# Patient Record
Sex: Female | Born: 1940 | Race: White | Hispanic: No | Marital: Married | Smoking: Never smoker
Health system: Southern US, Community
[De-identification: ages and names within clinical notes are randomized; demographics above are authoritative.]

## PROBLEM LIST (undated history)

## (undated) DIAGNOSIS — R51 Headache: Secondary | ICD-10-CM

## (undated) DIAGNOSIS — C801 Malignant (primary) neoplasm, unspecified: Secondary | ICD-10-CM

## (undated) DIAGNOSIS — I10 Essential (primary) hypertension: Secondary | ICD-10-CM

## (undated) DIAGNOSIS — C439 Malignant melanoma of skin, unspecified: Secondary | ICD-10-CM

## (undated) HISTORY — PX: ABDOMINAL HYSTERECTOMY: SHX81

## (undated) HISTORY — DX: Headache: R51

## (undated) HISTORY — PX: OTHER SURGICAL HISTORY: SHX169

## (undated) HISTORY — PX: TONSILLECTOMY: SUR1361

## (undated) HISTORY — DX: Malignant melanoma of skin, unspecified: C43.9

## (undated) HISTORY — PX: TUBAL LIGATION: SHX77

---

## 1998-03-19 ENCOUNTER — Ambulatory Visit (HOSPITAL_COMMUNITY): Admission: RE | Admit: 1998-03-19 | Discharge: 1998-03-19 | Payer: Self-pay | Admitting: Gastroenterology

## 2004-06-21 ENCOUNTER — Other Ambulatory Visit: Admission: RE | Admit: 2004-06-21 | Discharge: 2004-06-21 | Payer: Self-pay | Admitting: Obstetrics and Gynecology

## 2005-02-05 ENCOUNTER — Encounter: Admission: RE | Admit: 2005-02-05 | Discharge: 2005-02-05 | Payer: Self-pay | Admitting: Internal Medicine

## 2005-05-10 ENCOUNTER — Encounter: Admission: RE | Admit: 2005-05-10 | Discharge: 2005-05-10 | Payer: Self-pay | Admitting: *Deleted

## 2005-07-12 ENCOUNTER — Other Ambulatory Visit: Admission: RE | Admit: 2005-07-12 | Discharge: 2005-07-12 | Payer: Self-pay | Admitting: Obstetrics and Gynecology

## 2005-11-23 ENCOUNTER — Encounter: Admission: RE | Admit: 2005-11-23 | Discharge: 2005-11-23 | Payer: Self-pay | Admitting: Internal Medicine

## 2006-01-30 ENCOUNTER — Encounter: Admission: RE | Admit: 2006-01-30 | Discharge: 2006-01-30 | Payer: Self-pay

## 2006-04-25 ENCOUNTER — Encounter: Admission: RE | Admit: 2006-04-25 | Discharge: 2006-04-25 | Payer: Self-pay | Admitting: General Surgery

## 2006-05-08 ENCOUNTER — Ambulatory Visit (HOSPITAL_COMMUNITY): Admission: RE | Admit: 2006-05-08 | Discharge: 2006-05-08 | Payer: Self-pay | Admitting: Gastroenterology

## 2006-08-07 ENCOUNTER — Other Ambulatory Visit: Admission: RE | Admit: 2006-08-07 | Discharge: 2006-08-07 | Payer: Self-pay | Admitting: Obstetrics and Gynecology

## 2006-08-14 ENCOUNTER — Inpatient Hospital Stay (HOSPITAL_COMMUNITY): Admission: RE | Admit: 2006-08-14 | Discharge: 2006-08-17 | Payer: Self-pay | Admitting: General Surgery

## 2006-10-10 ENCOUNTER — Encounter: Admission: RE | Admit: 2006-10-10 | Discharge: 2006-10-10 | Payer: Self-pay | Admitting: General Surgery

## 2007-03-10 IMAGING — CR DG CHEST 2V
2 series · 2 of 2 positions shown · non-contrast
Comparison: 08/04/06.

CLINICAL DATA: Paraesophageal hernia.  Shortness of breath with exertion.  High blood pressure.
CHEST ? 2 VIEW:

[w chest pa]
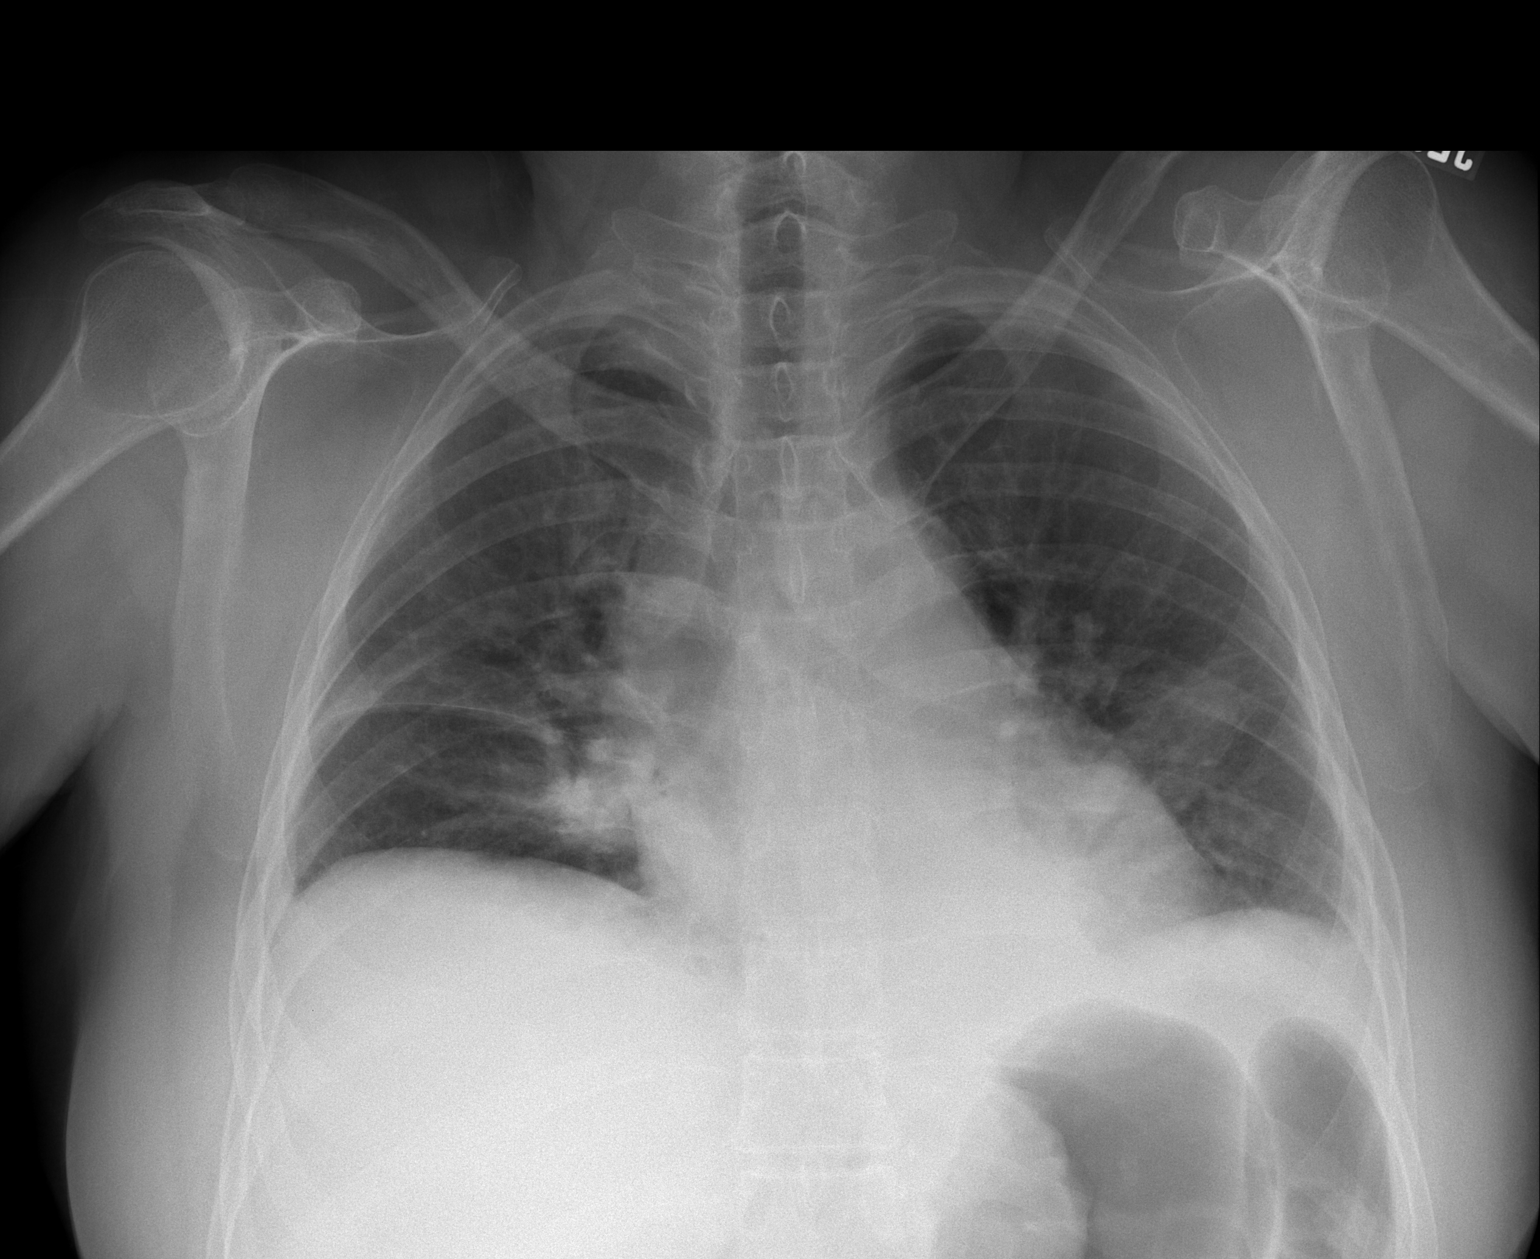

[w chest lat]
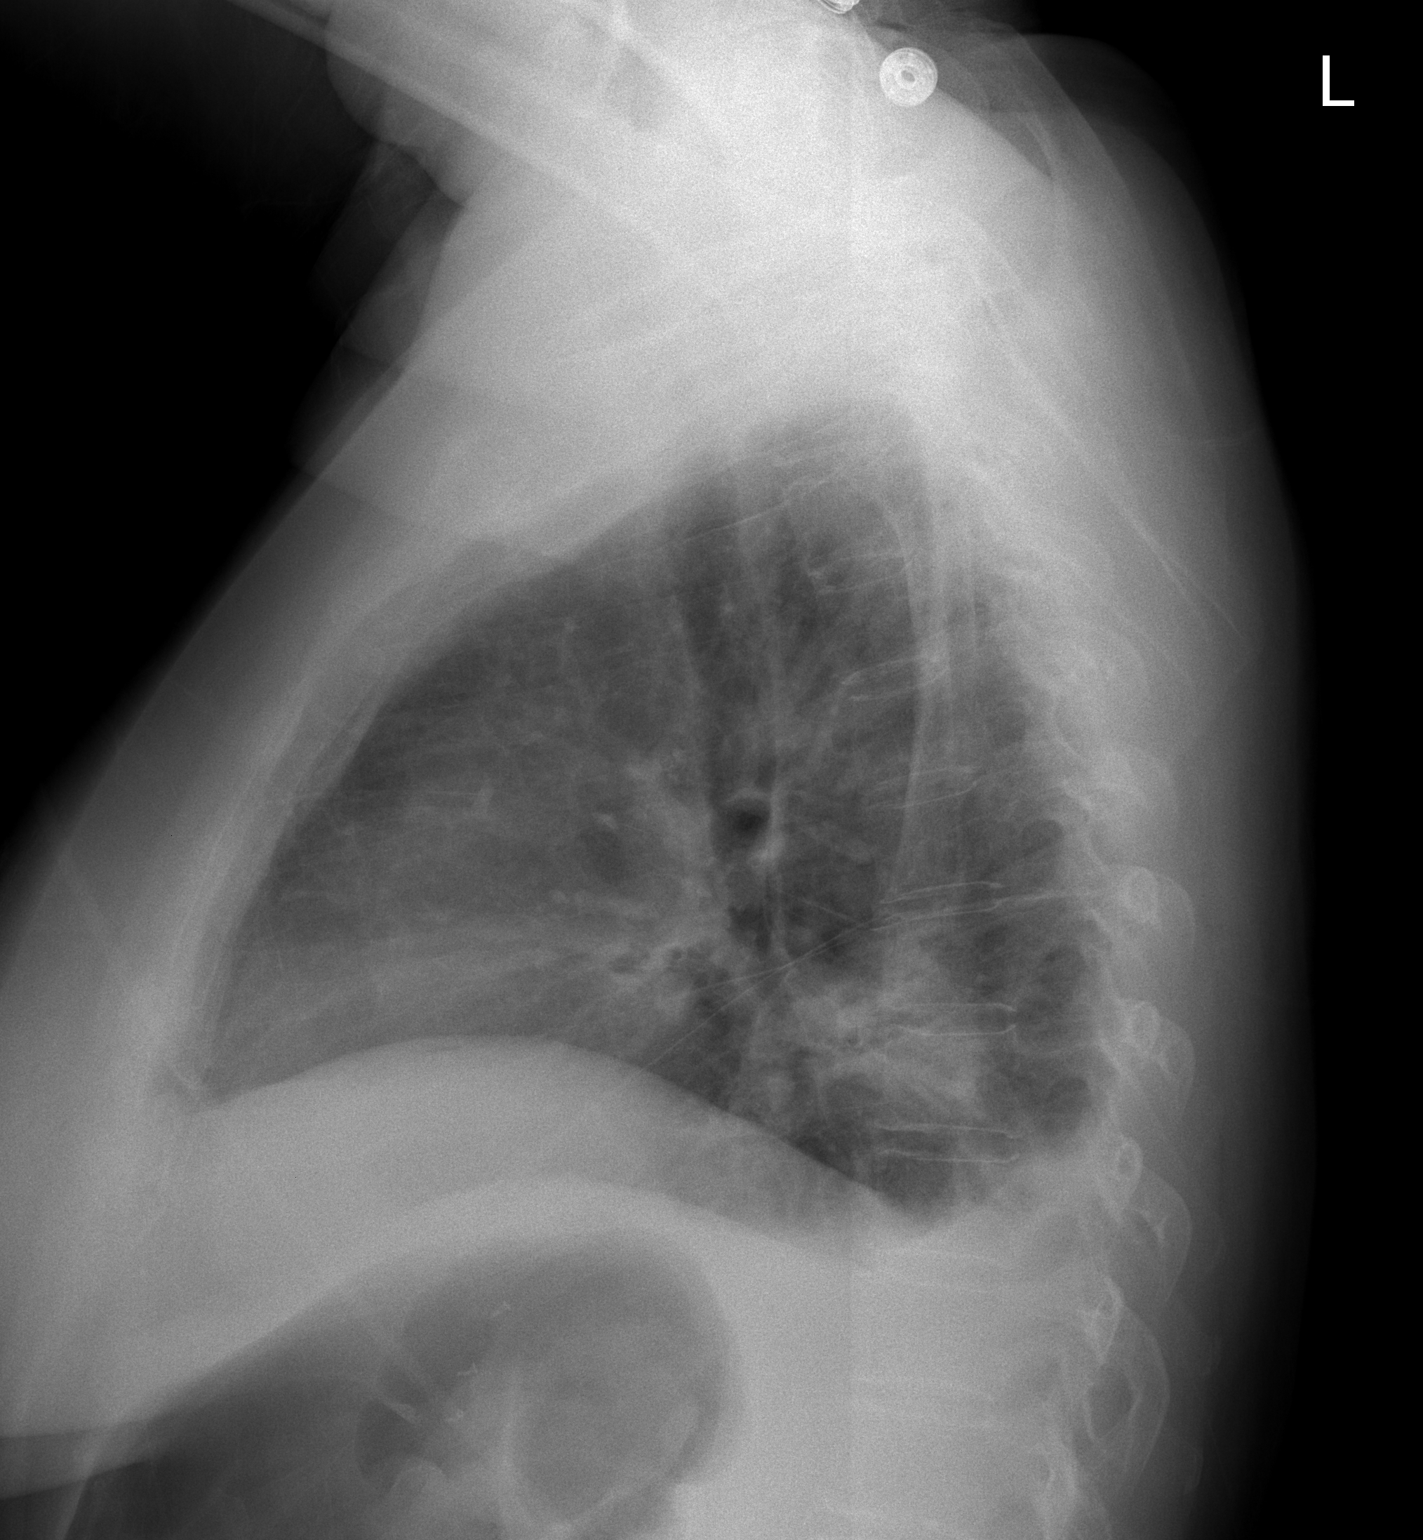

[2 of 2 positions shown; findings below may reference images not displayed]

FINDINGS: Band-like densities in the lower lung zones compatible with subsegmental atelectasis.  Pneumonia in the left lower lobe is a consideration, as well.  Small pleural effusions.
IMPRESSION: Subsegmental atelectasis in the lower lung zones, mainly the left lower lobe.  Cannot exclude left lower lobar pneumonia.  Small pleural effusions.

## 2007-08-02 ENCOUNTER — Ambulatory Visit: Payer: Self-pay | Admitting: Nurse Practitioner

## 2007-08-24 ENCOUNTER — Other Ambulatory Visit: Admission: RE | Admit: 2007-08-24 | Discharge: 2007-08-24 | Payer: Self-pay | Admitting: Obstetrics and Gynecology

## 2007-08-30 ENCOUNTER — Ambulatory Visit: Payer: Self-pay | Admitting: Nurse Practitioner

## 2007-10-18 ENCOUNTER — Ambulatory Visit: Payer: Self-pay | Admitting: Nurse Practitioner

## 2008-08-26 ENCOUNTER — Other Ambulatory Visit: Admission: RE | Admit: 2008-08-26 | Discharge: 2008-08-26 | Payer: Self-pay | Admitting: Obstetrics and Gynecology

## 2009-07-07 ENCOUNTER — Ambulatory Visit: Payer: Self-pay | Admitting: Nurse Practitioner

## 2009-08-27 ENCOUNTER — Other Ambulatory Visit: Admission: RE | Admit: 2009-08-27 | Discharge: 2009-08-27 | Payer: Self-pay | Admitting: Obstetrics and Gynecology

## 2010-08-03 ENCOUNTER — Ambulatory Visit: Payer: Self-pay | Admitting: Nurse Practitioner

## 2010-09-28 ENCOUNTER — Other Ambulatory Visit
Admission: RE | Admit: 2010-09-28 | Discharge: 2010-09-28 | Payer: Self-pay | Source: Home / Self Care | Admitting: Obstetrics and Gynecology

## 2010-10-10 ENCOUNTER — Encounter: Payer: Self-pay | Admitting: Internal Medicine

## 2011-02-01 NOTE — Assessment & Plan Note (Signed)
NAME:  Katelyn Moore, Katelyn Moore           ACCOUNT NO.:  [id number]   MEDICAL RECORD NO.:  [id number]          PATIENT TYPE:  POB   LOCATION:  [facility] at [facility]         FACILITY:  [facility]   PHYSICIAN:  Tinnie Gens, MD        DATE OF BIRTH:  22-Jan-1941   DATE OF SERVICE:                                  CLINIC NOTE   HISTORY OF PRESENT ILLNESS:  The patient comes to the office today for  followup on her migraine headaches.  The patient is usually seen  approximately once a year.  She is doing very well, in fact she is doing  well enough that she would like to decrease the dose of her imipramine.  She takes imipramine for headache prevention as well as insomnia.  She  has been taking 100 mg and would like to go down to 50.  Overall, she is  still doing very well.  She has a new grandson, Lorenz Coaster, and looking  forward to the holidays with him.   PHYSICAL EXAMINATION:  VITAL SIGNS:  Blood pressure is 129/72, pulse 65,  weight 136, height is 5 feet 1 inch.  GENERAL:  Well-developed, well-nourished 70 year old Caucasian female in  no acute distress.  HEENT:  Head is normocephalic and atraumatic.  Pupils are equal and  react.  NEUROLOGIC:  The patient is alert and oriented.  She has good muscle  tone and good muscle sensation.   ASSESSMENT:  Migraine.   PLAN:  We will decrease her imipramine to 50 mg nightly.  She gets #90  with 4 refills.  She will also have her Maxalt refilled.  Maxalt 10 mg  one p.o. p.r.n. migraine #12 with p.r.n. refills.  She will follow up  sooner if need be.      Remonia Richter, NP    ______________________________  Tinnie Gens, MD    LR/MEDQ  D:  08/03/2010  T:  08/04/2010  Job:  [id number]

## 2011-02-01 NOTE — Assessment & Plan Note (Signed)
NAME:  Katelyn Moore, Katelyn Moore           ACCOUNT NO.:  [id number]   MEDICAL RECORD NO.:  [id number]          PATIENT TYPE:  POB   LOCATION:  [facility] at [facility]         FACILITY:  [facility]   PHYSICIAN:  Allie Bossier, MD        DATE OF BIRTH:  05-28-1941   DATE OF SERVICE:                                  CLINIC NOTE   The patient comes to the office today for followup on her migraine  headaches.  The patient was noticing that she was having more headaches  likely related to having some increasing difficulty falling asleep.  She  states that there are times that it takes her up to 2 hours to fall  asleep.  She did increase her dose of imipramine from 50 mg to 100 and  that was helpful.  She is requesting a refill on her imipramine to 100  mg tablets.  She has been taking Maxalt with no difficulties.  She has a  new grandson Psychologist, forensic.  There was some difficulties around the birth.  She  has handled that very well.   PHYSICAL EXAMINATION:  VITAL SIGNS:  Blood pressure is 114/72, pulse is  71, weight 136, height 5 feet 1 inches.  HEENT:  Head is normocephalic and atraumatic.  Pupils equal and  reactive.  NEUROLOGIC:  The patient is alert, oriented with an appropriate affect.   ASSESSMENT:  Migraine.   PLAN:  The patient will increase her imipramine to 100 mg 1 p.o. at  bedtime #90 with 4 refills.  She will take a refill on her Maxalt 10 mg  one p.o. p.r.n. migraine #12 with p.r.n. refills.  She will follow up in  1 year or sooner as needed.      Remonia Richter, NP    ______________________________  Allie Bossier, MD    LR/MEDQ  D:  07/07/2009  T:  07/08/2009  Job:  [id number]

## 2011-02-04 NOTE — Op Note (Signed)
NAMEMYSTIC, LABO           ACCOUNT NO.:  [id number]   MEDICAL RECORD NO.:  [id number]          PATIENT TYPE:  AMB   LOCATION:  ENDO                         FACILITY:  [facility]   PHYSICIAN:  Tasia Catchings, M.D.   DATE OF BIRTH:  [birth year]   DATE OF PROCEDURE:  05/08/2006  DATE OF DISCHARGE:  05/08/2006                                 OPERATIVE REPORT   PROCEDURE:  Esophageal monometry.   BRIEF HISTORY:  Katelyn Moore is a 70 year old patient who has reflux  esophagitis and required esophageal dilatation in 1999.  However, in 2007,  she was discovered to have a paraesophageal hernia which requires repair.  The purpose of this procedure was to look for esophageal monometric evidence  of diffuse esophageal spasm precluding a Nissen fundoplication as well as  the repair.   FINDINGS:  1. Upper esophageal sphincter.  The resting pressure in the upper      esophageal sphincter appeared entirely normal with good relaxation time      with swallowing.  2. Esophageal body.  Nine wet swallows were obtained. Although the      computer suggested that 1 of the wet swallows led to a retrograde      contraction, I disagree with that and all 9 swallows appeared to have      peristaltic contractions.  In the distal esophagus, there were 2      episodes of repetitive contractions but no simultaneous contractions      were noted.  The remainder of the swallows produced absolutely normal      peristaltic contractions.  3. Lower esophageal sphincter.  Resting lower esophageal sphincter      pressure was on the low side of normal at 13.1, although this is not      necessarily reliable test for reflux.  The ileus did relax completely      with swallowing and on time suggesting there was no evidence of      achalasia.   FINAL IMPRESSION:  Normal monometry with no evidence of diffuse esophageal  spasm or achalasia.      Tasia Catchings, M.D.  Electronically Signed     JW/MEDQ  D:   05/11/2006  T:  05/11/2006  Job:  [id number]   cc:   Angelia Mould. Derrell Lolling, M.D.

## 2011-02-04 NOTE — Discharge Summary (Signed)
NAMESHAKYRA, MATTERA           ACCOUNT NO.:  [id number]   MEDICAL RECORD NO.:  [id number]          PATIENT TYPE:  INP   LOCATION:  1605                         FACILITY:  [facility]   PHYSICIAN:  Angelia Mould. Derrell Lolling, M.D.DATE OF BIRTH:  Nov 23, 1940   DATE OF ADMISSION:  08/14/2006  DATE OF DISCHARGE:  08/17/2006                               DISCHARGE SUMMARY   FINAL DIAGNOSES:  1. Paraesophageal hernia.  2. Gastroesophageal reflux disease.  3. Postoperative atelectasis, resolved.  4. Hypertension.  5. Migraine headaches.   OPERATION PERFORMED:  Laparoscopic reduction and repair of  paraesophageal hernia, laparoscopic Nissen fundoplication.   HISTORY:  This is a healthy 70 year old white female who has had reflux  symptoms for many years.  She has also had some dysphagia requiring  dilatation in the past.  She is dependent on proton pump inhibitors but  gets good relief of symptoms from proton pump inhibitors.  She also has  water brash and regurgitation in the recumbent position regardless of  medication.  A recent upper GI showed a large paraesophageal hernia but  no obstruction or stricture.  Recent upper endoscopy showed the  paraesophageal hernia to be small or to medium in size no other  abnormalities.  She also a recent colonoscopy which was normal.  I saw  her in evaluation as an outpatient.  She then went and had an ultrasound  which showed only a polyp in the gallbladder.  She had a manometry which  showed normal esophageal peristalsis.  Repair of her diaphragmatic  hernia and antireflux surgery was offered as an option for her.  Because  of her continued symptoms,  she wanted to have that done.  She was  admitted to the hospital electively.   PHYSICAL EXAMINATION:  Healthy, middle-aged woman in no distress.  LUNGS:  Were clear to auscultation.  HEART:  Regular rate and rhythm, no murmur.  ABDOMEN:  Soft, nontender.  Liver and spleen not enlarged.  Well-healed  infraumbilical laparoscopic scar.  Well-healed Pfannenstiel incision.  No mass, no hernias.   HOSPITAL COURSE:  On the date of admission, the patient was taken to the  operating room and underwent laparoscopic reduction and repair of her  paraesophageal hernia and a laparoscopic Nissen fundoplication.  Surgery  was uneventful.   Postoperatively, the patient did well.  A Gastrografin esophagogram was  normal on postoperative day #1, and she was started on clear liquid diet  at that time.  On postoperative day #2, she was feeling okay but had a  little bit of  left shoulder pain with deep breath.  Oxygen saturations  were 92-94% on room air.  A chest x-ray showed some atelectasis at the  bases.  She did not appear to be feeling well enough to go home, was  having some headache as well.   On August 17, 2006, she was feeling much better and was ready to go  home, had been tolerating a full liquid diet and was swallowing well  without dysphagia.  Hemoglobin was 11.5, white blood cell count was  10,400.   She was advised on activities and a blenderized  diet.  She was given a  prescription for Vicodin for pain and told to continue her usual  medications including her proton-pump inhibitors.  She was to follow-up  in the office in 2 weeks.      Angelia Mould. Derrell Lolling, M.D.  Electronically Signed     HMI/MEDQ  D:  08/28/2006  T:  08/28/2006  Job:  [id number]   cc:   Tasia Catchings, M.D.  Fax: [phone]   Darius Bump, M.D.  Fax: [phone]

## 2011-02-04 NOTE — Op Note (Signed)
Katelyn Moore, Katelyn Moore           ACCOUNT NO.:  [id number]   MEDICAL RECORD NO.:  [id number]          PATIENT TYPE:  OIB   LOCATION:  0098                         FACILITY:  [facility]   PHYSICIAN:  Angelia Mould. Derrell Lolling, M.D.DATE OF BIRTH:  10-Oct-1940   DATE OF PROCEDURE:  08/14/2006  DATE OF DISCHARGE:                                 OPERATIVE REPORT   PREOPERATIVE DIAGNOSES:  1. Paraesophageal hernia.  2. Gastroesophageal reflux disease.   POSTOPERATIVE DIAGNOSES:  1. Paraesophageal hernia.  2. Gastroesophageal reflux disease.   OPERATION PERFORMED:  1. Laparoscopic reduction and repair of paraesophageal hernia.  2. Laparoscopic Nissen fundoplication.   SURGEON:  Angelia Mould. Derrell Lolling, M.D.   ASSISTANT:  Sandria Bales. Ezzard Standing, M.D.   OPERATIVE INDICATIONS:  This is a 70 year old white female who has had  reflux symptoms for years with some dysphagia requiring dilatation in the  past.  There is no history of ulcer and no history of Barrett's esophagitis.  She is very dependent on proton pump inhibitors and has an excellent result  with proton pump inhibitors but gets very symptomatic if she stops that  medication.  Upper GI shows a moderate sized paraesophageal hernia.  Upper  endoscopy showed basically normal findings except for the paraesophageal  hernia.  A colonoscopy has been done and that is normal.  A gallbladder  ultrasound shows only a small 3 mm polyp.  Manometry shows good esophageal  peristalsis with good amplitude.  She is brought to operating room  electively.   OPERATIVE TECHNIQUE:  Following the induction of general endotracheal  anesthesia, the patient was identified as to correct patient and correct  procedure.  A Foley catheter was inserted.  Intravenous Ancef was given.  The patient's abdomen was prepped and draped in a sterile fashion.  0.5%  Marcaine with epinephrine was used as local infiltration anesthetic.  An 11  mm OptiVu port was placed in the left rectus  sheath about 2 cm above the  umbilicus.  This insertion was atraumatic.  Pneumoperitoneum was created.  A  video camera was inserted.  There was no bleeding or signs of any injury.  There really were no adhesions.  Two 11 mm trocars were placed in the right  upper quadrant, an 11 mm trocar placed in the left subcostal region for  retraction, and a 5 mm trocar placed the subxiphoid region.  We ultimately  removed the 5 mm trocar in the subxiphoid region and placed a Nathanson  retractor and this was placed to carefully retract the left lobe of liver.  This was held in place with a self-retaining retractor.   We found that there was a fair amount of chronic adhesions around the  hiatus.  The liver, gallbladder, stomach, duodenum, small intestine, and  large intestine were otherwise grossly normal to inspection.  Using a  harmonic scalpel, we divided the gastrohepatic omentum exposing the right  crus of the diaphragm.  We divided the attachments of the anterior border of  the right crus and with blunt dissection dissected it away from the  esophagus and the retroesophageal fatty tissues.  We carried this dissection  anteriorly freeing up the apex of the crura and then down the anterior  border of the left crus somewhat.  In doing so, we reduced the hernia which  was not that large.  There was not much or hernia sac but there was a great  deal of fatty tissue anteriorly which we ultimately had to debride away from  the gastroesophageal junction with the harmonic scalpel.  This tissue was  then discarded.   We took down the short gastric vessels with the harmonic scalpel.  These  were somewhat stretched out by the hernia and it was fairly easy to see and  stay well away from the spleen but by doing so we completely mobilize the  fundus and left it quite floppy.   We then went back to the dissection on the right side and found that we  could easily make a window behind the esophagus.  There  was a lipoma  associated with the posterior wall of the esophagus which we debrided down  and pulled that down somewhat.  We then put the Penrose drain behind the  esophagus for mobilization.  At this point, we completed the dissection of  the left crus posteriorly and we could easily see both the right and left  crura coming together inferiorly and posteriorly.  Hemostasis was excellent  at this point.  We had injured the capsule of the left lobe of the liver  with one of the instruments that required some cautery and lost about 15 mL  of blood from that but that was completely hemostatic at this point.  The  spleen looked good as well.   We closed the crura with interrupted sutures of 0 Surgitek.  These were tied  over PTFE felt pledgets.  We made sure that we had completed the dissection  of posterior esophagus quite well and that we had a good length of esophagus  in the abdominal cavity.  We placed three or four interrupted sutures of the  0 Surgitek with pledgeted PTFE felt and closed the crura without any tension  at all.  We then passed a 50 lighted bougie down into the stomach and found  that it would go without too much difficulty but there was no space between  the crura and the esophagus left, so no further sutures were placed.   We pulled the bougie back somewhat past the fundus behind the esophagus from  left to right.  We then pulled the fat pad down and found a piece of  anterior fundus on the left side.  We reinserted the bougie and made sure  that we had a very floppy fundoplication to set up and we could do this  without any tension whatsoever.  The fundoplication was performed using  three interrupted sutures of 0 Surgitek.  Each of these sutures took a bite  of the fundus on the left, a right-anterior bite of the esophagus, and then  the fundus on the right side.  These were tied with the Ti-knot device. After all three of these were placed, the bougie was removed.   The repair  looked good.  There was no sign of any bleeding or hematoma.  We irrigated  out the field.  We found no bleeding or any sign of any ischemia or  perforation.  The trocars were then removed under direct vision.  There was  no bleeding from any of the trocar sites.  The pneumoperitoneum was  released.  The skin incisions  were closed with Steri-Strips and skin  staples.  Clean bandages were placed and the patient taken to the recovery  room in stable condition.  Estimated blood loss was about 30 mL.  Complications none.  Sponge and instrument counts were correct.      Angelia Mould. Derrell Lolling, M.D.  Electronically Signed     HMI/MEDQ  D:  08/14/2006  T:  08/14/2006  Job:  [id number]   cc:   Tasia Catchings, M.D.  Fax: [phone]   Darius Bump, M.D.  Fax: [phone]

## 2011-08-29 ENCOUNTER — Telehealth: Payer: Self-pay | Admitting: *Deleted

## 2011-08-29 MED ORDER — RIZATRIPTAN BENZOATE 10 MG PO TABS: 10.0000 mg | ORAL_TABLET | Freq: Once | ORAL | Status: DC | PRN

## 2011-08-29 NOTE — Telephone Encounter (Signed)
Patient needs refill of Maxalt to get her through until she can get in to see [facility].

## 2011-09-27 ENCOUNTER — Encounter: Payer: Self-pay | Admitting: Nurse Practitioner

## 2011-09-27 ENCOUNTER — Ambulatory Visit (INDEPENDENT_AMBULATORY_CARE_PROVIDER_SITE_OTHER): Payer: MEDICARE | Admitting: Nurse Practitioner

## 2011-09-27 DIAGNOSIS — G43009 Migraine without aura, not intractable, without status migrainosus: Secondary | ICD-10-CM | POA: Insufficient documentation

## 2011-09-27 DIAGNOSIS — G43909 Migraine, unspecified, not intractable, without status migrainosus: Secondary | ICD-10-CM

## 2011-09-27 MED ORDER — RIZATRIPTAN BENZOATE 10 MG PO TABS: 10.0000 mg | ORAL_TABLET | Freq: Once | ORAL | Status: DC | PRN

## 2011-09-27 MED ORDER — IMIPRAMINE PAMOATE 100 MG PO CAPS: 100.0000 mg | ORAL_CAPSULE | Freq: Every day | ORAL | Status: DC

## 2011-09-27 NOTE — Patient Instructions (Signed)
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
## 2012-09-30 ENCOUNTER — Other Ambulatory Visit: Payer: Self-pay | Admitting: Nurse Practitioner

## 2012-10-09 ENCOUNTER — Encounter: Payer: Self-pay | Admitting: Nurse Practitioner

## 2012-10-09 ENCOUNTER — Ambulatory Visit (INDEPENDENT_AMBULATORY_CARE_PROVIDER_SITE_OTHER): Payer: MEDICARE | Admitting: Nurse Practitioner

## 2012-10-09 VITALS — BP 118/76 | HR 68 | Ht 61.0 in | Wt 134.0 lb

## 2012-10-09 DIAGNOSIS — G43909 Migraine, unspecified, not intractable, without status migrainosus: Secondary | ICD-10-CM

## 2012-10-09 MED ORDER — RIZATRIPTAN BENZOATE 10 MG PO TABS: 10.0000 mg | ORAL_TABLET | ORAL | Status: DC | PRN

## 2012-10-09 MED ORDER — IMIPRAMINE PAMOATE 100 MG PO CAPS: 100.0000 mg | ORAL_CAPSULE | Freq: Every day | ORAL | Status: DC

## 2012-10-09 NOTE — Patient Instructions (Addendum)
Migraine Headache A migraine headache is an intense, throbbing pain on one or both sides of your head. A migraine can last for 30 minutes to several hours. CAUSES  The exact cause of a migraine headache is not always known. However, a migraine may be caused when nerves in the brain become irritated and release chemicals that cause inflammation. This causes pain. SYMPTOMS  Pain on one or both sides of your head.  Pulsating or throbbing pain.  Severe pain that prevents daily activities.  Pain that is aggravated by any physical activity.  Nausea, vomiting, or both.  Dizziness.  Pain with exposure to bright lights, loud noises, or activity.  General sensitivity to bright lights, loud noises, or smells. Before you get a migraine, you may get warning signs that a migraine is coming (aura). An aura may include:  Seeing flashing lights.  Seeing bright spots, halos, or zig-zag lines.  Having tunnel vision or blurred vision.  Having feelings of numbness or tingling.  Having trouble talking.  Having muscle weakness. MIGRAINE TRIGGERS  Alcohol.  Smoking.  Stress.  Menstruation.  Aged cheeses.  Foods or drinks that contain nitrates, glutamate, aspartame, or tyramine.  Lack of sleep.  Chocolate.  Caffeine.  Hunger.  Physical exertion.  Fatigue.  Medicines used to treat chest pain (nitroglycerine), birth control pills, estrogen, and some blood pressure medicines. DIAGNOSIS  A migraine headache is often diagnosed based on:  Symptoms.  Physical examination.  A CT scan or MRI of your head. TREATMENT Medicines may be given for pain and nausea. Medicines can also be given to help prevent recurrent migraines.  HOME CARE INSTRUCTIONS  Only take over-the-counter or prescription medicines for pain or discomfort as directed by your caregiver. The use of long-term narcotics is not recommended.  Lie down in a dark, quiet room when you have a migraine.  Keep a journal  to find out what may trigger your migraine headaches. For example, write down:  What you eat and drink.  How much sleep you get.  Any change to your diet or medicines.  Limit alcohol consumption.  Quit smoking if you smoke.  Get 7 to 9 hours of sleep, or as recommended by your caregiver.  Limit stress.  Keep lights dim if bright lights bother you and make your migraines worse. SEEK IMMEDIATE MEDICAL CARE IF:   Your migraine becomes severe.  You have a fever.  You have a stiff neck.  You have vision loss.  You have muscular weakness or loss of muscle control.  You start losing your balance or have trouble walking.  You feel faint or pass out.  You have severe symptoms that are different from your first symptoms. MAKE SURE YOU:   Understand these instructions.  Will watch your condition.  Will get help right away if you are not doing well or get worse. Document Released: 09/05/2005 Document Revised: 11/28/2011 Document Reviewed: 08/26/2011 Exit[employer] Patient Information 2013 [employer].  

## 2012-10-09 NOTE — Progress Notes (Signed)
S: Pt is in office today for yearly follow up on migraines. She was doing very well up to one week ago when she ran out of her Imipramine. She called the office once but did not follow up when medication was not called in. Since then she has not done well. Usually she does well with Maxalt and Excedrin and Caffeine. She is exercising almost daily including aerobics and walking.   O: Alert, oriented Cardiac: RRR Lungs Clear  A: Migraine  P: Refill Imipramine, Maxalt RTC one year or prn

## 2012-11-28 ENCOUNTER — Other Ambulatory Visit (HOSPITAL_COMMUNITY)
Admission: RE | Admit: 2012-11-28 | Discharge: 2012-11-28 | Disposition: A | Discharge status: Home / Self Care | Payer: MEDICARE | Source: Ambulatory Visit | Attending: Obstetrics and Gynecology | Admitting: Obstetrics and Gynecology

## 2012-11-28 ENCOUNTER — Other Ambulatory Visit: Payer: Self-pay | Admitting: Obstetrics and Gynecology

## 2012-11-28 DIAGNOSIS — Z01419 Encounter for gynecological examination (general) (routine) without abnormal findings: Secondary | ICD-10-CM | POA: Insufficient documentation

## 2012-11-28 DIAGNOSIS — Z1151 Encounter for screening for human papillomavirus (HPV): Secondary | ICD-10-CM | POA: Insufficient documentation

## 2013-10-07 ENCOUNTER — Telehealth: Payer: Self-pay | Admitting: *Deleted

## 2013-10-07 DIAGNOSIS — G43909 Migraine, unspecified, not intractable, without status migrainosus: Secondary | ICD-10-CM

## 2013-10-07 MED ORDER — IMIPRAMINE PAMOATE 100 MG PO CAPS: 100.0000 mg | ORAL_CAPSULE | Freq: Every day | ORAL | Status: DC

## 2013-10-07 NOTE — Telephone Encounter (Signed)
Patient needs refill on her imipramine.  She made an appointment for the 27th to see [facility].

## 2013-10-15 ENCOUNTER — Ambulatory Visit (INDEPENDENT_AMBULATORY_CARE_PROVIDER_SITE_OTHER): Payer: Medicare Other | Admitting: Nurse Practitioner

## 2013-10-15 ENCOUNTER — Encounter: Payer: Self-pay | Admitting: Nurse Practitioner

## 2013-10-15 VITALS — BP 119/83 | HR 75 | Ht 60.75 in | Wt 140.2 lb

## 2013-10-15 DIAGNOSIS — G43909 Migraine, unspecified, not intractable, without status migrainosus: Secondary | ICD-10-CM

## 2013-10-15 MED ORDER — IMIPRAMINE HCL 50 MG PO TABS: 50.0000 mg | ORAL_TABLET | Freq: Every day | ORAL | Status: DC

## 2013-10-15 MED ORDER — RIZATRIPTAN BENZOATE 10 MG PO TABS: 10.0000 mg | ORAL_TABLET | ORAL | Status: DC | PRN

## 2013-10-15 NOTE — Progress Notes (Addendum)
History:  Katelyn Moore is a 73 y.o. No obstetric history on file. who presents to [place] today for yearly visit for migraine headaches. She has been doing well this year with her migraines and would like to decrease her prevention to Imipramine 50 mg. She had been at 100 mg. She will stay on Maxalt as needed and taper dose down on Imipramine. She has no new health or social issues.  The following portions of the patient's history were reviewed and updated as appropriate: allergies, current medications, past family history, past medical history, past social history, past surgical history and problem list.  Review of Systems:  Pertinent items are noted in HPI.  Objective:  Physical Exam BP 119/83  Pulse 75  Ht 5' 0.75" (1.543 m)  Wt 140 lb 3.2 oz (63.594 kg)  BMI 26.71 kg/m2 GENERAL: Well-developed, well-nourished female in no acute distress.  HEENT: Normocephalic, atraumatic.  NECK: Supple. Normal thyroid.  LUNGS: Normal rate. Clear to auscultation bilaterally.  HEART: Regular rate and rhythm with no adventitious sounds.  EXTREMITIES: No cyanosis, clubbing, or edema, 2+ distal pulses.   Labs and Imaging No results found.  Assessment & Plan:  Assessment:  Migraine Headaches  Plans:  Decrease dose of Imipramine to 50 mg q hs Refill Maxalt Follow up if any changes occur, otherwise yearly exam   Olegario Messier, NP 10/15/2013 2:10 PM  Attestation of Attending Supervision of Advanced Practitioner (CNM/NP): Evaluation and management procedures were performed by the Advanced Practitioner under my supervision and collaboration.  I have reviewed the Advanced Practitioner's note and chart, and I agree with the management and plan.  CONSTANT,PEGGY 12/17/2013 1:17 PM

## 2013-10-15 NOTE — Patient Instructions (Signed)
Migraine Headache A migraine headache is an intense, throbbing pain on one or both sides of your head. A migraine can last for 30 minutes to several hours. CAUSES  The exact cause of a migraine headache is not always known. However, a migraine may be caused when nerves in the brain become irritated and release chemicals that cause inflammation. This causes pain. Certain things may also trigger migraines, such as:  Alcohol.  Smoking.  Stress.  Menstruation.  Aged cheeses.  Foods or drinks that contain nitrates, glutamate, aspartame, or tyramine.  Lack of sleep.  Chocolate.  Caffeine.  Hunger.  Physical exertion.  Fatigue.  Medicines used to treat chest pain (nitroglycerine), birth control pills, estrogen, and some blood pressure medicines. SIGNS AND SYMPTOMS  Pain on one or both sides of your head.  Pulsating or throbbing pain.  Severe pain that prevents daily activities.  Pain that is aggravated by any physical activity.  Nausea, vomiting, or both.  Dizziness.  Pain with exposure to bright lights, loud noises, or activity.  General sensitivity to bright lights, loud noises, or smells. Before you get a migraine, you may get warning signs that a migraine is coming (aura). An aura may include:  Seeing flashing lights.  Seeing bright spots, halos, or zig-zag lines.  Having tunnel vision or blurred vision.  Having feelings of numbness or tingling.  Having trouble talking.  Having muscle weakness. DIAGNOSIS  A migraine headache is often diagnosed based on:  Symptoms.  Physical exam.  A CT scan or MRI of your head. These imaging tests cannot diagnose migraines, but they can help rule out other causes of headaches. TREATMENT Medicines may be given for pain and nausea. Medicines can also be given to help prevent recurrent migraines.  HOME CARE INSTRUCTIONS  Only take over-the-counter or prescription medicines for pain or discomfort as directed by your  health care provider. The use of long-term narcotics is not recommended.  Lie down in a dark, quiet room when you have a migraine.  Keep a journal to find out what may trigger your migraine headaches. For example, write down:  What you eat and drink.  How much sleep you get.  Any change to your diet or medicines.  Limit alcohol consumption.  Quit smoking if you smoke.  Get 7 9 hours of sleep, or as recommended by your health care provider.  Limit stress.  Keep lights dim if bright lights bother you and make your migraines worse. SEEK IMMEDIATE MEDICAL CARE IF:   Your migraine becomes severe.  You have a fever.  You have a stiff neck.  You have vision loss.  You have muscular weakness or loss of muscle control.  You start losing your balance or have trouble walking.  You feel faint or pass out.  You have severe symptoms that are different from your first symptoms. MAKE SURE YOU:   Understand these instructions.  Will watch your condition.  Will get help right away if you are not doing well or get worse. Document Released: 09/05/2005 Document Revised: 06/26/2013 Document Reviewed: 05/13/2013 [employer] Patient Information 2014 [employer].  

## 2014-09-30 ENCOUNTER — Ambulatory Visit (INDEPENDENT_AMBULATORY_CARE_PROVIDER_SITE_OTHER): Payer: Medicare Other | Admitting: Nurse Practitioner

## 2014-09-30 ENCOUNTER — Encounter: Payer: Self-pay | Admitting: Nurse Practitioner

## 2014-09-30 VITALS — BP 123/77 | HR 74 | Wt 140.0 lb

## 2014-09-30 DIAGNOSIS — G43009 Migraine without aura, not intractable, without status migrainosus: Secondary | ICD-10-CM

## 2014-09-30 MED ORDER — RIZATRIPTAN BENZOATE 10 MG PO TABS: 10.0000 mg | ORAL_TABLET | ORAL | Status: DC | PRN

## 2014-09-30 MED ORDER — IMIPRAMINE HCL 50 MG PO TABS: 50.0000 mg | ORAL_TABLET | Freq: Every day | ORAL | Status: DC

## 2014-09-30 NOTE — Patient Instructions (Signed)

## 2014-09-30 NOTE — Progress Notes (Signed)
History:  Katelyn Moore is a 74 y.o. No obstetric history on file. who presents to [facility] clinic today for yearly follow up with migraines. She states her migraines are about the same and usually she does not use the entire 12 tablets for headaches. She usually catches them early and does not use Motrin at the same time. She has had 2 health issues in this past year, one is left nipple inverting. She sae Dr Landry Mellow, had a mammogram and ultrasound. The second issues is of an abnormal EKG whith an insurance physical. She has this done in August and will be seeing her PCP soon for further evaluation. She has no cardiac issues to her knowledge.   The following portions of the patient's history were reviewed and updated as appropriate: allergies, current medications, past family history, past medical history, past social history, past surgical history and problem list.  Review of Systems:    Objective:  Physical Exam BP 123/77 mmHg  Pulse 74  Wt 140 lb (63.504 kg) GENERAL: Well-developed, well-nourished female in no acute distress.  HEENT: Normocephalic, atraumatic.  NECK: Supple. Normal thyroid.  LUNGS: Normal rate. Clear to auscultation bilaterally.  HEART: Irregular rate and rhythm EXTREMITIES: No cyanosis, clubbing, or edema, 2+ distal pulses.   Labs and Imaging No results found.  Assessment & Plan:  Assessment:  Migraine  Plans:  Refill medications Advised to follow with PCP for repeat EKG  Follow up one year  Olegario Messier, NP 09/30/2014 4:56 PM

## 2014-11-05 ENCOUNTER — Other Ambulatory Visit: Payer: Self-pay | Admitting: Radiology

## 2014-11-18 ENCOUNTER — Other Ambulatory Visit (INDEPENDENT_AMBULATORY_CARE_PROVIDER_SITE_OTHER): Payer: Self-pay | Admitting: General Surgery

## 2014-11-18 DIAGNOSIS — D242 Benign neoplasm of left breast: Secondary | ICD-10-CM

## 2014-11-21 ENCOUNTER — Encounter (HOSPITAL_BASED_OUTPATIENT_CLINIC_OR_DEPARTMENT_OTHER): Payer: Self-pay | Admitting: *Deleted

## 2014-11-21 ENCOUNTER — Encounter (HOSPITAL_BASED_OUTPATIENT_CLINIC_OR_DEPARTMENT_OTHER)
Admission: RE | Admit: 2014-11-21 | Discharge: 2014-11-21 | Disposition: A | Discharge status: Home / Self Care | Payer: Medicare Other | Source: Ambulatory Visit | Attending: General Surgery | Admitting: General Surgery

## 2014-11-21 DIAGNOSIS — I10 Essential (primary) hypertension: Secondary | ICD-10-CM | POA: Diagnosis not present

## 2014-11-21 DIAGNOSIS — Z885 Allergy status to narcotic agent status: Secondary | ICD-10-CM | POA: Diagnosis not present

## 2014-11-21 DIAGNOSIS — G43909 Migraine, unspecified, not intractable, without status migrainosus: Secondary | ICD-10-CM | POA: Diagnosis not present

## 2014-11-21 DIAGNOSIS — K219 Gastro-esophageal reflux disease without esophagitis: Secondary | ICD-10-CM | POA: Diagnosis not present

## 2014-11-21 DIAGNOSIS — D242 Benign neoplasm of left breast: Secondary | ICD-10-CM | POA: Diagnosis present

## 2014-11-21 DIAGNOSIS — Z9889 Other specified postprocedural states: Secondary | ICD-10-CM | POA: Diagnosis not present

## 2014-11-21 DIAGNOSIS — Z90710 Acquired absence of both cervix and uterus: Secondary | ICD-10-CM | POA: Diagnosis not present

## 2014-11-21 DIAGNOSIS — Z803 Family history of malignant neoplasm of breast: Secondary | ICD-10-CM | POA: Diagnosis not present

## 2014-11-21 DIAGNOSIS — Z886 Allergy status to analgesic agent status: Secondary | ICD-10-CM | POA: Diagnosis not present

## 2014-11-21 DIAGNOSIS — F1099 Alcohol use, unspecified with unspecified alcohol-induced disorder: Secondary | ICD-10-CM | POA: Diagnosis not present

## 2014-11-21 DIAGNOSIS — E785 Hyperlipidemia, unspecified: Secondary | ICD-10-CM | POA: Diagnosis not present

## 2014-11-21 LAB — CBC WITH DIFFERENTIAL/PLATELET
BASOS PCT: 0 % (ref 0–1)
Basophils Absolute: 0 10*3/uL (ref 0.0–0.1)
EOS PCT: 2 % (ref 0–5)
Eosinophils Absolute: 0.1 10*3/uL (ref 0.0–0.7)
HEMATOCRIT: 39.5 % (ref 36.0–46.0)
Hemoglobin: 13.3 g/dL (ref 12.0–15.0)
Lymphocytes Relative: 28 % (ref 12–46)
Lymphs Abs: 1.7 10*3/uL (ref 0.7–4.0)
MCH: 31.4 pg (ref 26.0–34.0)
MCHC: 33.7 g/dL (ref 30.0–36.0)
MCV: 93.4 fL (ref 78.0–100.0)
MONO ABS: 0.6 10*3/uL (ref 0.1–1.0)
Monocytes Relative: 9 % (ref 3–12)
Neutro Abs: 3.8 10*3/uL (ref 1.7–7.7)
Neutrophils Relative %: 61 % (ref 43–77)
PLATELETS: 230 10*3/uL (ref 150–400)
RBC: 4.23 MIL/uL (ref 3.87–5.11)
RDW: 12.4 % (ref 11.5–15.5)
WBC: 6.2 10*3/uL (ref 4.0–10.5)

## 2014-11-21 LAB — COMPREHENSIVE METABOLIC PANEL
ALK PHOS: 81 U/L (ref 39–117)
ALT: 20 U/L (ref 0–35)
AST: 27 U/L (ref 0–37)
Albumin: 4.1 g/dL (ref 3.5–5.2)
Anion gap: 8 (ref 5–15)
BUN: 18 mg/dL (ref 6–23)
CO2: 30 mmol/L (ref 19–32)
Calcium: 9.4 mg/dL (ref 8.4–10.5)
Chloride: 99 mmol/L (ref 96–112)
Creatinine, Ser: 0.86 mg/dL (ref 0.50–1.10)
GFR calc non Af Amer: 65 mL/min — ABNORMAL LOW (ref >90)
GFR, EST AFRICAN AMERICAN: 76 mL/min — AB (ref >90)
GLUCOSE: 121 mg/dL — AB (ref 70–99)
POTASSIUM: 4 mmol/L (ref 3.5–5.1)
SODIUM: 137 mmol/L (ref 135–145)
Total Bilirubin: 0.8 mg/dL (ref 0.3–1.2)
Total Protein: 6.5 g/dL (ref 6.0–8.3)

## 2014-11-21 NOTE — Progress Notes (Addendum)
Requested EKG from Dr. Quin Hoop office - pt had done Jan. 2016. Pt coming today for CMET CBC, Diff.

## 2014-11-21 NOTE — H&P (Signed)
Katelyn Moore  Location: [facility] Patient #: [id number] DOB: 02-26-1941 Married / Language: English / Race: White Female      History of Present Illness   The patient is a 74 year old female who presents with a breast mass. She is referred by Dr. Christene Slates at [facility] mammography for evaluation of intraductal papilloma of the left breast, subareolar area. Dr. Kelton Pillar is her PCP and Dr. Christophe Louis is her gynecologist. She has not had any breast problems in the past. Gets annual mammograms. Noticed a little bit of flattening of the left nipple and noticed a little bit of colorless drainage on her nightgown. This was very recent. No blood. Mammograms and ultrasound were performed on October 30, 2014. Mammograms were unremarkable. The ultrasound shows a complex cyst in the retroareolar area at the 8 o'clock position, papilloma was suggested. Ultrasound-guided biopsy of the left breast upper inner quadrant performed on November 05, 2014 shows sclerosed intraductal papilloma. I have reviewed her mammograms, postbiopsy, and the marker clip appears to be fairly centralized in the retroareolar area and back about 2 cm. She was referred for consideration of excision. Comorbidities include hypertension, hyperlipidemia. Operative performed laparoscopic repair paraesophageal hernia many years ago she has enjoyed a good result from that. Family history reveals breast cancer in a paternal aunt. Ovarian cancer in another paternal aunt. She is married and has 2 sons denies tobacco. Retired. We decided to proceed with left breast lumpectomy with radioactive seed localization. She is very much in favor of clarifying her diagnosis. I discussed the indications, details, techniques, and numerous risk of the surgery with her. She is aware of the risk of bleeding, infection, cosmetic deformity, skin necrosis, reoperation if this is cancer, and other unforeseen problems.  She understands all of these issues and all of her questions are answered. She agrees with this plan.1   Other Problems Cancer Gastroesophageal Reflux Disease High blood pressure Migraine Headache  Past Surgical History  Breast Biopsy Left. Hysterectomy (not due to cancer) - Partial Nissen Fundoplication Tonsillectomy  Diagnostic Studies History  Colonoscopy 5-10 years ago Mammogram within last year Pap Smear 1-5 years ago  Allergies  Codeine Phosphate *ANALGESICS - OPIOID* Morphine Sulfate (Concentrate) *ANALGESICS - OPIOID*  Medication History  Atenolol (100MG  Tablet, Oral) Active. Hydrochlorothiazide (12.5MG  Tablet, Oral) Active. Imipramine HCl (50MG  Tablet, Oral) Active. Maxalt-MLT (10MG  Tablet Disperse, Oral) Active. Simvastatin (20MG  Tablet, Oral) Active. Medications Reconciled  Social History  Alcohol use Occasional alcohol use. Caffeine use Coffee, Tea. No drug use Tobacco use Never smoker.  Family History  Breast Cancer Family Members In General. Heart Disease Father. Hypertension Brother, Father, Sister. Melanoma Mother. Migraine Headache Mother. Ovarian Cancer Family Members In General.  Pregnancy / Birth History Age at menarche 58 years. Age of menopause 51-55 Contraceptive History Oral contraceptives. Gravida 3 Maternal age 6-30 Para 2 Regular periods  Review of Systems General Present- Night Sweats. Not Present- Appetite Loss, Chills, Fatigue, Fever, Weight Gain and Weight Loss. Skin Not Present- Change in Wart/Mole, Dryness, Hives, Jaundice, New Lesions, Non-Healing Wounds, Rash and Ulcer. HEENT Present- Wears glasses/contact lenses. Not Present- Earache, Hearing Loss, Hoarseness, Nose Bleed, Oral Ulcers, Ringing in the Ears, Seasonal Allergies, Sinus Pain, Sore Throat, Visual Disturbances and Yellow Eyes. Respiratory Not Present- Bloody sputum, Chronic Cough, Difficulty Breathing, Snoring and  Wheezing. Breast Present- Breast Mass and Nipple Discharge. Not Present- Breast Pain and Skin Changes. Cardiovascular Not Present- Chest Pain, Difficulty Breathing Lying Down, Leg Cramps, Palpitations, Rapid Heart Rate,  Shortness of Breath and Swelling of Extremities. Gastrointestinal Not Present- Abdominal Pain, Bloating, Bloody Stool, Change in Bowel Habits, Chronic diarrhea, Constipation, Difficulty Swallowing, Excessive gas, Gets full quickly at meals, Hemorrhoids, Indigestion, Nausea, Rectal Pain and Vomiting. Female Genitourinary Not Present- Frequency, Nocturia, Painful Urination, Pelvic Pain and Urgency. Musculoskeletal Not Present- Back Pain, Joint Pain, Joint Stiffness, Muscle Pain, Muscle Weakness and Swelling of Extremities. Neurological Present- Headaches. Not Present- Decreased Memory, Fainting, Numbness, Seizures, Tingling, Tremor, Trouble walking and Weakness. Psychiatric Not Present- Anxiety, Bipolar, Change in Sleep Pattern, Depression, Fearful and Frequent crying. Endocrine Present- Hot flashes. Not Present- Cold Intolerance, Excessive Hunger, Hair Changes, Heat Intolerance and New Diabetes. Hematology Not Present- Easy Bruising, Excessive bleeding, Gland problems, HIV and Persistent Infections.   Vitals  11/18/2014 4:29 PM Weight: 139 lb Height: 61in Body Surface Area: 1.65 m Body Mass Index: 26.26 kg/m Temp.: 97.52F(Temporal)  Pulse: 77 (Regular)  BP: 122/72 (Sitting, Left Arm, Standard)    Physical Exam  General Mental Status-Alert. General Appearance-Consistent with stated age. Hydration-Well hydrated. Voice-Normal.  Head and Neck Head-normocephalic, atraumatic with no lesions or palpable masses. Trachea-midline. Thyroid Gland Characteristics - normal size and consistency.  Eye Eyeball - Bilateral-Extraocular movements intact. Sclera/Conjunctiva - Bilateral-No scleral icterus.  Chest and Lung Exam Chest and lung exam reveals  -quiet, even and easy respiratory effort with no use of accessory muscles and on auscultation, normal breath sounds, no adventitious sounds and normal vocal resonance. Inspection Chest Wall - Normal. Back - normal.  Breast Breast - Left-Symmetric and Biopsy scar, Non Tender, No Dimpling, No Inflammation, No Lumpectomy scars, No Mastectomy scars, No Peau d' Orange. Breast - Right-Symmetric, Non Tender, No Biopsy scars, no Dimpling, No Inflammation, No Lumpectomy scars, No Mastectomy scars, No Peau d' Orange. Breast Lump-No Palpable Breast Mass. Note: Left breast reveals a little bit of bruising periareolar area inferior and lateral. No mass or hematoma. Nipple and areolar complex looks fine. No axillary adenopathy. Right breast and axilla are unremarkable.   Cardiovascular Cardiovascular examination reveals -normal heart sounds, regular rate and rhythm with no murmurs and normal pedal pulses bilaterally.  Abdomen Inspection Inspection of the abdomen reveals - No Hernias. Palpation/Percussion Palpation and Percussion of the abdomen reveal - Soft, Non Tender, No Rebound tenderness, No Rigidity (guarding) and No hepatosplenomegaly. Auscultation Auscultation of the abdomen reveals - Bowel sounds normal. Note: Abdomen soft and nontender. No mass or organomegaly. Well-healed trocar sites from her hiatal hernia repair.   Neurologic Neurologic evaluation reveals -alert and oriented x 3 with no impairment of recent or remote memory. Mental Status-Normal.  Musculoskeletal Normal Exam - Left-Upper Extremity Strength Normal and Lower Extremity Strength Normal. Normal Exam - Right-Upper Extremity Strength Normal and Lower Extremity Strength Normal.  Lymphatic Head & Neck  General Head & Neck Lymphatics: Bilateral - Description - Normal. Axillary  General Axillary Region: Bilateral - Description - Normal. Tenderness - Non Tender. Femoral & Inguinal  Generalized Femoral &  Inguinal Lymphatics: Bilateral - Description - Normal. Tenderness - Non Tender.    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 The biopsy shows a benign tumor called intraductal papilloma There is a 90% or greater chance that this is completely benign, but conservative excision is recommended to rule out in situ cancer We have discussed the indications, techniques, and numerous risks of this surgery in detail You will  be scheduled for left breast lumpectomy with radioactive seed localization in the near future. HYPERTENSION, BENIGN (401.1  I10) Impression: Takes hydrochlorothiazide and atenolol HISTORY OF REPAIR OF HIATAL HERNIA (V15.29  Z98.89) Impression: Good long-term result   Edsel Petrin. Dalbert Batman, M.D., [facility] General and Minimally invasive Surgery Breast and Colorectal Surgery Office:   [phone] Pager:   [phone]

## 2014-11-24 ENCOUNTER — Ambulatory Visit (HOSPITAL_BASED_OUTPATIENT_CLINIC_OR_DEPARTMENT_OTHER): Payer: Medicare Other | Admitting: Anesthesiology

## 2014-11-24 ENCOUNTER — Encounter (HOSPITAL_BASED_OUTPATIENT_CLINIC_OR_DEPARTMENT_OTHER)
Admission: RE | Disposition: A | Discharge status: Home / Self Care | Payer: Self-pay | Source: Ambulatory Visit | Attending: General Surgery

## 2014-11-24 ENCOUNTER — Encounter (HOSPITAL_BASED_OUTPATIENT_CLINIC_OR_DEPARTMENT_OTHER): Payer: Self-pay | Admitting: *Deleted

## 2014-11-24 ENCOUNTER — Ambulatory Visit (HOSPITAL_BASED_OUTPATIENT_CLINIC_OR_DEPARTMENT_OTHER)
Admission: RE | Admit: 2014-11-24 | Discharge: 2014-11-24 | Disposition: A | Discharge status: Home / Self Care | Payer: Medicare Other | Source: Ambulatory Visit | Attending: General Surgery | Admitting: General Surgery

## 2014-11-24 DIAGNOSIS — Z803 Family history of malignant neoplasm of breast: Secondary | ICD-10-CM | POA: Insufficient documentation

## 2014-11-24 DIAGNOSIS — D242 Benign neoplasm of left breast: Secondary | ICD-10-CM | POA: Diagnosis not present

## 2014-11-24 DIAGNOSIS — Z90710 Acquired absence of both cervix and uterus: Secondary | ICD-10-CM | POA: Insufficient documentation

## 2014-11-24 DIAGNOSIS — Z9889 Other specified postprocedural states: Secondary | ICD-10-CM | POA: Insufficient documentation

## 2014-11-24 DIAGNOSIS — Z886 Allergy status to analgesic agent status: Secondary | ICD-10-CM | POA: Insufficient documentation

## 2014-11-24 DIAGNOSIS — E785 Hyperlipidemia, unspecified: Secondary | ICD-10-CM | POA: Insufficient documentation

## 2014-11-24 DIAGNOSIS — G43909 Migraine, unspecified, not intractable, without status migrainosus: Secondary | ICD-10-CM | POA: Insufficient documentation

## 2014-11-24 DIAGNOSIS — I10 Essential (primary) hypertension: Secondary | ICD-10-CM | POA: Diagnosis not present

## 2014-11-24 DIAGNOSIS — F1099 Alcohol use, unspecified with unspecified alcohol-induced disorder: Secondary | ICD-10-CM | POA: Insufficient documentation

## 2014-11-24 DIAGNOSIS — K219 Gastro-esophageal reflux disease without esophagitis: Secondary | ICD-10-CM | POA: Insufficient documentation

## 2014-11-24 DIAGNOSIS — Z885 Allergy status to narcotic agent status: Secondary | ICD-10-CM | POA: Insufficient documentation

## 2014-11-24 HISTORY — DX: Essential (primary) hypertension: I10

## 2014-11-24 HISTORY — DX: Malignant (primary) neoplasm, unspecified: C80.1

## 2014-11-24 HISTORY — PX: BREAST LUMPECTOMY WITH RADIOACTIVE SEED LOCALIZATION: SHX6424

## 2014-11-24 SURGERY — BREAST LUMPECTOMY WITH RADIOACTIVE SEED LOCALIZATION
Anesthesia: General | Site: Breast | Laterality: Left

## 2014-11-24 MED ORDER — CEFAZOLIN SODIUM-DEXTROSE 2-3 GM-% IV SOLR: 2.0000 g | INTRAVENOUS | Status: DC

## 2014-11-24 MED ORDER — CEFAZOLIN SODIUM-DEXTROSE 2-3 GM-% IV SOLR
INTRAVENOUS | Status: DC | PRN
  Administered 2014-11-24: 2 g via INTRAVENOUS

## 2014-11-24 MED ORDER — OXYCODONE HCL 5 MG PO TABS: 5.0000 mg | ORAL_TABLET | Freq: Once | ORAL | Status: DC | PRN

## 2014-11-24 MED ORDER — SODIUM CHLORIDE 0.9 % IJ SOLN: 3.0000 mL | Freq: Two times a day (BID) | INTRAMUSCULAR | Status: DC

## 2014-11-24 MED ORDER — HYDROCODONE-ACETAMINOPHEN 5-325 MG PO TABS: 1.0000 | ORAL_TABLET | Freq: Four times a day (QID) | ORAL | Status: DC | PRN

## 2014-11-24 MED ORDER — PROPOFOL 10 MG/ML IV BOLUS
INTRAVENOUS | Status: DC | PRN
  Administered 2014-11-24: 150 mg via INTRAVENOUS

## 2014-11-24 MED ORDER — MIDAZOLAM HCL 5 MG/5ML IJ SOLN
INTRAMUSCULAR | Status: DC | PRN
  Administered 2014-11-24: 2 mg via INTRAVENOUS

## 2014-11-24 MED ORDER — BUPIVACAINE-EPINEPHRINE (PF) 0.5% -1:200000 IJ SOLN
INTRAMUSCULAR | Status: AC
  Filled 2014-11-24: qty 30

## 2014-11-24 MED ORDER — ONDANSETRON HCL 4 MG/2ML IJ SOLN: 4.0000 mg | Freq: Once | INTRAMUSCULAR | Status: DC | PRN

## 2014-11-24 MED ORDER — LACTATED RINGERS IV SOLN
INTRAVENOUS | Status: DC
  Administered 2014-11-24 (×2): via INTRAVENOUS

## 2014-11-24 MED ORDER — OXYCODONE HCL 5 MG/5ML PO SOLN: 5.0000 mg | Freq: Once | ORAL | Status: DC | PRN

## 2014-11-24 MED ORDER — GLYCOPYRROLATE 0.2 MG/ML IJ SOLN
INTRAMUSCULAR | Status: DC | PRN
  Administered 2014-11-24: 0.2 mg via INTRAVENOUS

## 2014-11-24 MED ORDER — BUPIVACAINE-EPINEPHRINE (PF) 0.5% -1:200000 IJ SOLN
INTRAMUSCULAR | Status: DC | PRN
  Administered 2014-11-24: 10 mL via PERINEURAL

## 2014-11-24 MED ORDER — FENTANYL CITRATE 0.05 MG/ML IJ SOLN
INTRAMUSCULAR | Status: DC | PRN
  Administered 2014-11-24 (×2): 50 ug via INTRAVENOUS

## 2014-11-24 MED ORDER — SODIUM CHLORIDE 0.9 % IV SOLN: INTRAVENOUS | Status: DC

## 2014-11-24 MED ORDER — CEFAZOLIN SODIUM-DEXTROSE 2-3 GM-% IV SOLR
INTRAVENOUS | Status: AC
  Filled 2014-11-24: qty 50

## 2014-11-24 MED ORDER — EPHEDRINE SULFATE 50 MG/ML IJ SOLN
INTRAMUSCULAR | Status: DC | PRN
  Administered 2014-11-24: 10 mg via INTRAVENOUS

## 2014-11-24 MED ORDER — CHLORHEXIDINE GLUCONATE 4 % EX LIQD: 1.0000 "application " | Freq: Once | CUTANEOUS | Status: DC

## 2014-11-24 MED ORDER — ONDANSETRON HCL 4 MG/2ML IJ SOLN
INTRAMUSCULAR | Status: DC | PRN
  Administered 2014-11-24: 4 mg via INTRAVENOUS

## 2014-11-24 MED ORDER — SODIUM CHLORIDE 0.9 % IV SOLN: 250.0000 mL | INTRAVENOUS | Status: DC | PRN

## 2014-11-24 MED ORDER — FENTANYL CITRATE 0.05 MG/ML IJ SOLN
25.0000 ug | INTRAMUSCULAR | Status: DC | PRN
Start: 2014-11-24 — End: 2014-11-24

## 2014-11-24 MED ORDER — HYDROMORPHONE HCL 1 MG/ML IJ SOLN: 0.2500 mg | INTRAMUSCULAR | Status: DC | PRN

## 2014-11-24 MED ORDER — DEXAMETHASONE SODIUM PHOSPHATE 4 MG/ML IJ SOLN
INTRAMUSCULAR | Status: DC | PRN
  Administered 2014-11-24: 10 mg via INTRAVENOUS

## 2014-11-24 MED ORDER — MIDAZOLAM HCL 2 MG/2ML IJ SOLN: 1.0000 mg | INTRAMUSCULAR | Status: DC | PRN

## 2014-11-24 MED ORDER — FENTANYL CITRATE 0.05 MG/ML IJ SOLN: 50.0000 ug | INTRAMUSCULAR | Status: DC | PRN

## 2014-11-24 MED ORDER — ACETAMINOPHEN 325 MG PO TABS: 650.0000 mg | ORAL_TABLET | ORAL | Status: DC | PRN

## 2014-11-24 MED ORDER — OXYCODONE HCL 5 MG PO TABS
5.0000 mg | ORAL_TABLET | ORAL | Status: DC | PRN
Start: 2014-11-24 — End: 2014-11-24

## 2014-11-24 MED ORDER — LIDOCAINE HCL (CARDIAC) 20 MG/ML IV SOLN
INTRAVENOUS | Status: DC | PRN
  Administered 2014-11-24: 50 mg via INTRAVENOUS

## 2014-11-24 MED ORDER — MIDAZOLAM HCL 2 MG/2ML IJ SOLN
INTRAMUSCULAR | Status: AC
  Filled 2014-11-24: qty 2

## 2014-11-24 MED ORDER — SODIUM CHLORIDE 0.9 % IJ SOLN: 3.0000 mL | INTRAMUSCULAR | Status: DC | PRN

## 2014-11-24 MED ORDER — FENTANYL CITRATE 0.05 MG/ML IJ SOLN
INTRAMUSCULAR | Status: AC
  Filled 2014-11-24: qty 6

## 2014-11-24 MED ORDER — ACETAMINOPHEN 650 MG RE SUPP: 650.0000 mg | RECTAL | Status: DC | PRN

## 2014-11-24 MED ORDER — PHENYLEPHRINE HCL 10 MG/ML IJ SOLN
INTRAMUSCULAR | Status: DC | PRN
  Administered 2014-11-24: 40 ug via INTRAVENOUS

## 2014-11-24 SURGICAL SUPPLY — 65 items
ADH SKN CLS APL DERMABOND .7 (GAUZE/BANDAGES/DRESSINGS) ×1
APL SKNCLS STERI-STRIP NONHPOA (GAUZE/BANDAGES/DRESSINGS)
APPLIER CLIP 9.375 MED OPEN (MISCELLANEOUS)
APR CLP MED 9.3 20 MLT OPN (MISCELLANEOUS)
BENZOIN TINCTURE PRP APPL 2/3 (GAUZE/BANDAGES/DRESSINGS) IMPLANT
BINDER BREAST LRG (GAUZE/BANDAGES/DRESSINGS) IMPLANT
BINDER BREAST MEDIUM (GAUZE/BANDAGES/DRESSINGS) IMPLANT
BINDER BREAST XLRG (GAUZE/BANDAGES/DRESSINGS) IMPLANT
BINDER BREAST XXLRG (GAUZE/BANDAGES/DRESSINGS) IMPLANT
BLADE HEX COATED 2.75 (ELECTRODE) ×2 IMPLANT
BLADE SURG 10 STRL SS (BLADE) IMPLANT
BLADE SURG 15 STRL LF DISP TIS (BLADE) ×1 IMPLANT
BLADE SURG 15 STRL SS (BLADE) ×2
CANISTER SUC SOCK COL 7IN (MISCELLANEOUS) ×1 IMPLANT
CANISTER SUCT 1200ML W/VALVE (MISCELLANEOUS) ×2 IMPLANT
CHLORAPREP W/TINT 26ML (MISCELLANEOUS) ×2 IMPLANT
CLIP APPLIE 9.375 MED OPEN (MISCELLANEOUS) IMPLANT
COVER BACK TABLE 60X90IN (DRAPES) ×2 IMPLANT
COVER MAYO STAND STRL (DRAPES) ×2 IMPLANT
COVER PROBE W GEL 5X96 (DRAPES) ×2 IMPLANT
DECANTER SPIKE VIAL GLASS SM (MISCELLANEOUS) IMPLANT
DERMABOND ADVANCED (GAUZE/BANDAGES/DRESSINGS) ×1
DERMABOND ADVANCED .7 DNX12 (GAUZE/BANDAGES/DRESSINGS) ×1 IMPLANT
DEVICE DUBIN W/COMP PLATE 8390 (MISCELLANEOUS) ×2 IMPLANT
DRAIN CHANNEL 19F RND (DRAIN) IMPLANT
DRAPE LAPAROSCOPIC ABDOMINAL (DRAPES) ×2 IMPLANT
DRAPE UTILITY XL STRL (DRAPES) ×2 IMPLANT
DRSG PAD ABDOMINAL 8X10 ST (GAUZE/BANDAGES/DRESSINGS) IMPLANT
ELECT REM PT RETURN 9FT ADLT (ELECTROSURGICAL) ×2
ELECTRODE REM PT RTRN 9FT ADLT (ELECTROSURGICAL) ×1 IMPLANT
EVACUATOR SILICONE 100CC (DRAIN) IMPLANT
GLOVE BIOGEL PI IND STRL 7.0 (GLOVE) IMPLANT
GLOVE BIOGEL PI INDICATOR 7.0 (GLOVE) ×1
GLOVE ECLIPSE 6.5 STRL STRAW (GLOVE) ×1 IMPLANT
GLOVE EUDERMIC 7 POWDERFREE (GLOVE) ×2 IMPLANT
GOWN STRL REUS W/ TWL LRG LVL3 (GOWN DISPOSABLE) ×1 IMPLANT
GOWN STRL REUS W/ TWL XL LVL3 (GOWN DISPOSABLE) ×1 IMPLANT
GOWN STRL REUS W/TWL LRG LVL3 (GOWN DISPOSABLE) ×2
GOWN STRL REUS W/TWL XL LVL3 (GOWN DISPOSABLE) ×2
KIT MARKER MARGIN INK (KITS) IMPLANT
NDL HYPO 25X1 1.5 SAFETY (NEEDLE) IMPLANT
NEEDLE HYPO 25X1 1.5 SAFETY (NEEDLE) ×2 IMPLANT
NS IRRIG 1000ML POUR BTL (IV SOLUTION) ×2 IMPLANT
PACK BASIN DAY SURGERY FS (CUSTOM PROCEDURE TRAY) ×2 IMPLANT
PENCIL BUTTON HOLSTER BLD 10FT (ELECTRODE) ×2 IMPLANT
PIN SAFETY STERILE (MISCELLANEOUS) ×2 IMPLANT
SHEET MEDIUM DRAPE 40X70 STRL (DRAPES) IMPLANT
SLEEVE SCD COMPRESS KNEE MED (MISCELLANEOUS) ×2 IMPLANT
SPONGE GAUZE 4X4 12PLY STER LF (GAUZE/BANDAGES/DRESSINGS) IMPLANT
SPONGE LAP 18X18 X RAY DECT (DISPOSABLE) IMPLANT
SPONGE LAP 4X18 X RAY DECT (DISPOSABLE) ×2 IMPLANT
STRIP CLOSURE SKIN 1/2X4 (GAUZE/BANDAGES/DRESSINGS) IMPLANT
SUT ETHILON 3 0 FSL (SUTURE) IMPLANT
SUT MNCRL AB 4-0 PS2 18 (SUTURE) ×2 IMPLANT
SUT SILK 2 0 SH (SUTURE) ×2 IMPLANT
SUT VIC AB 2-0 CT1 27 (SUTURE)
SUT VIC AB 2-0 CT1 TAPERPNT 27 (SUTURE) IMPLANT
SUT VIC AB 3-0 SH 27 (SUTURE)
SUT VIC AB 3-0 SH 27X BRD (SUTURE) IMPLANT
SUT VICRYL 3-0 CR8 SH (SUTURE) ×2 IMPLANT
SYRINGE 10CC LL (SYRINGE) ×1 IMPLANT
TOWEL OR 17X24 6PK STRL BLUE (TOWEL DISPOSABLE) ×2 IMPLANT
TOWEL OR NON WOVEN STRL DISP B (DISPOSABLE) ×2 IMPLANT
TUBE CONNECTING 20X1/4 (TUBING) ×2 IMPLANT
YANKAUER SUCT BULB TIP NO VENT (SUCTIONS) ×2 IMPLANT

## 2014-11-24 NOTE — Interval H&P Note (Signed)
History and Physical Interval Note:  11/24/2014 9:44 AM  Katelyn Moore  has presented today for surgery, with the diagnosis of papilloma left breast  The various methods of treatment have been discussed with the patient and family. After consideration of risks, benefits and other options for treatment, the patient has consented to  Procedure(s): BREAST LUMPECTOMY WITH RADIOACTIVE SEED LOCALIZATION (Left) as a surgical intervention .  The patient's history has been reviewed, patient examined today, no change in status, stable for surgery.  I have reviewed the patient's chart and labs.  Questions were answered to the patient's satisfaction.     Adin Hector

## 2014-11-24 NOTE — Op Note (Signed)
Patient Name:           Katelyn Moore   Date of Surgery:        11/24/2014  Pre op Diagnosis:      Intraductal papilloma left breast, subareolar  Post op Diagnosis:    Same  Procedure:                 Left breast subareolar lumpectomy with radioactive seed localization  Surgeon:                     Edsel Petrin. Dalbert Batman, M.D., FACS  Assistant:                      OR staff  Operative Indications:    The patient is a 74 year old female who presents with a breast mass. She is referred by Dr. Christene Slates at [facility] mammography for evaluation of intraductal papilloma of the left breast, subareolar area. Dr. Kelton Pillar is her PCP and Dr. Christophe Louis is her gynecologist. She has not had any breast problems in the past. Gets annual mammograms. Noticed a little bit of flattening of the left nipple and noticed a little bit of colorless drainage on her nightgown. This was very recent. No blood. Mammograms and ultrasound were performed on October 30, 2014. Mammograms were unremarkable. The ultrasound shows a complex cyst in the retroareolar area at the 8 o'clock position, papilloma was suggested. Ultrasound-guided biopsy of the left breast upper inner quadrant performed on November 05, 2014 shows sclerosed intraductal papilloma. I have reviewed her mammograms, postbiopsy, and the marker clip appears to be fairly centralized in the retroareolar area and back about 2 cm. She was referred for consideration of excision.  Family history reveals breast cancer in a paternal aunt. Ovarian cancer in another paternal aunt. We decided to proceed with left breast lumpectomy with radioactive seed localization.   Operative Findings:       I-125 radioactivity was identified in the left breast subareolar area in the holding area with the neoprobe. The specimen mammogram showed the marker clip and radioactive seed within the center of the specimen. There was no residual radioactivity within the breast.  Pathology confirmed receipt of the radioactive seed.  Procedure in Detail:          Radioactive seed placement was performed this morning and the patient was brought to [facility] day surgery center. General anesthesia with LMA device was performed. The left breast was prepped and draped in a sterile fashion. Surgical timeout was performed. Intravenous antibiotics were given. 0.5% Marcaine with epinephrine was used as local infiltration anesthetic. Using the neoprobe I identified the area of maximum radioactivity which was in the left breast subareolar area at about the 4:00 position. I made a curvilinear incision at the areolar margin in the lower inner quadrant. Dissection was carried under the nipple and around the radioactivity. The specimen was removed and marked with silk sutures in 3 cardinal positions to orient the pathologist. Specimen mammogram looked good and the specimen was sent to the lab after marking. Hemostasis was excellent and achieved with electrocautery. The wound was irrigated with saline. The breast tissue was reapproximated with 3-0 Vicryl sutures and the skin closed with a running subcuticular 4-0 Monocryl and Dermabond. Breast binder was placed the patient taken to PACU in stable condition. EBL 10 mL or less. Counts correct. Complications none.     Edsel Petrin. Dalbert Batman, M.D., FACS General and Minimally Invasive  Surgery Breast and Colorectal Surgery  11/24/2014 11:27 AM

## 2014-11-24 NOTE — Anesthesia Postprocedure Evaluation (Signed)
  Anesthesia Post-op Note  Patient: Katelyn Moore  Procedure(s) Performed: Procedure(s): BREAST LUMPECTOMY WITH RADIOACTIVE SEED LOCALIZATION (Left)  Patient Location: PACU  Anesthesia Type: General   Level of Consciousness: awake, alert  and oriented  Airway and Oxygen Therapy: Patient Spontanous Breathing  Post-op Pain: none  Post-op Assessment: Post-op Vital signs reviewed  Post-op Vital Signs: Reviewed  Last Vitals:  Filed Vitals:   11/24/14 1303  BP: 127/66  Pulse: 74  Temp: 36.3 C  Resp: 16    Complications: No apparent anesthesia complications

## 2014-11-24 NOTE — Anesthesia Procedure Notes (Signed)
Procedure Name: LMA Insertion Date/Time: 11/24/2014 10:44 AM Performed by: Toula Moos L Pre-anesthesia Checklist: Patient identified, Emergency Drugs available, Suction available, Patient being monitored and Timeout performed Patient Re-evaluated:Patient Re-evaluated prior to inductionOxygen Delivery Method: Circle System Utilized Preoxygenation: Pre-oxygenation with 100% oxygen Intubation Type: IV induction Ventilation: Mask ventilation without difficulty LMA: LMA inserted LMA Size: 4.0 Number of attempts: 1 Airway Equipment and Method: Bite block Placement Confirmation: positive ETCO2 Tube secured with: Tape Dental Injury: Teeth and Oropharynx as per pre-operative assessment

## 2014-11-24 NOTE — Transfer of Care (Signed)
Immediate Anesthesia Transfer of Care Note  Patient: JANIS SOL  Procedure(s) Performed: Procedure(s): BREAST LUMPECTOMY WITH RADIOACTIVE SEED LOCALIZATION (Left)  Patient Location: PACU  Anesthesia Type:General  Level of Consciousness: sedated and patient cooperative  Airway & Oxygen Therapy: Patient Spontanous Breathing and Patient connected to face mask oxygen  Post-op Assessment: Report given to RN and Post -op Vital signs reviewed and stable  Post vital signs: Reviewed and stable  Last Vitals:  Filed Vitals:   11/24/14 0933  BP: 150/69  Pulse: 63  Temp: 36.4 C  Resp: 16    Complications: No apparent anesthesia complications

## 2014-11-24 NOTE — Anesthesia Preprocedure Evaluation (Signed)
Anesthesia Evaluation  Patient identified by MRN, date of birth, ID band Patient awake    Reviewed: Allergy & Precautions, NPO status , Patient's Chart, lab work & pertinent test results  Airway Mallampati: I  TM Distance: >3 FB Neck ROM: Full    Dental  (+) Teeth Intact, Dental Advisory Given   Pulmonary  breath sounds clear to auscultation        Cardiovascular hypertension, Pt. on medications and Pt. on home beta blockers Rhythm:Regular Rate:Normal     Neuro/Psych    GI/Hepatic   Endo/Other    Renal/GU      Musculoskeletal   Abdominal   Peds  Hematology   Anesthesia Other Findings   Reproductive/Obstetrics                             Anesthesia Physical Anesthesia Plan  ASA: I  Anesthesia Plan: General   Post-op Pain Management:    Induction: Intravenous  Airway Management Planned: LMA  Additional Equipment:   Intra-op Plan:   Post-operative Plan: Extubation in OR  Informed Consent: I have reviewed the patients History and Physical, chart, labs and discussed the procedure including the risks, benefits and alternatives for the proposed anesthesia with the patient or authorized representative who has indicated his/her understanding and acceptance.   Dental advisory given  Plan Discussed with: CRNA, Anesthesiologist and Surgeon  Anesthesia Plan Comments:         Anesthesia Quick Evaluation

## 2014-11-24 NOTE — Discharge Instructions (Signed)
[facility] °Office Phone Number [phone] ° °BREAST BIOPSY/ PARTIAL MASTECTOMY: POST OP INSTRUCTIONS ° °Always review your discharge instruction sheet given to you by the facility where your surgery was performed. ° °IF YOU HAVE DISABILITY OR FAMILY LEAVE FORMS, YOU MUST BRING THEM TO THE OFFICE FOR PROCESSING.  DO NOT GIVE THEM TO YOUR DOCTOR. ° °1. A prescription for pain medication may be given to you upon discharge.  Take your pain medication as prescribed, if needed.  If narcotic pain medicine is not needed, then you may take acetaminophen (Tylenol) or ibuprofen (Advil) as needed. °2. Take your usually prescribed medications unless otherwise directed °3. If you need a refill on your pain medication, please contact your pharmacy.  They will contact our office to request authorization.  Prescriptions will not be filled after 5pm or on week-ends. °4. You should eat very light the first 24 hours after surgery, such as soup, crackers, pudding, etc.  Resume your normal diet the day after surgery. °5. Most patients will experience some swelling and bruising in the breast.  Ice packs and a good support bra will help.  Swelling and bruising can take several days to resolve.  °6. It is common to experience some constipation if taking pain medication after surgery.  Increasing fluid intake and taking a stool softener will usually help or prevent this problem from occurring.  A mild laxative (Milk of Magnesia or Miralax) should be taken according to package directions if there are no bowel movements after 48 hours. °7. Unless discharge instructions indicate otherwise, you may remove your bandages 24-48 hours after surgery, and you may shower at that time.  You may have steri-strips (small skin tapes) in place directly over the incision.  These strips should be left on the skin for 7-10 days.  If your surgeon used skin glue on the incision, you may shower in 24 hours.  The glue will flake off over the  next 2-3 weeks.  Any sutures or staples will be removed at the office during your follow-up visit. °8. ACTIVITIES:  You may resume regular daily activities (gradually increasing) beginning the next day.  Wearing a good support bra or sports bra minimizes pain and swelling.  You may have sexual intercourse when it is comfortable. °a. You may drive when you no longer are taking prescription pain medication, you can comfortably wear a seatbelt, and you can safely maneuver your car and apply brakes. °b. RETURN TO WORK:  ______________________________________________________________________________________ °9. You should see your doctor in the office for a follow-up appointment approximately two weeks after your surgery.  Your doctor’s nurse will typically make your follow-up appointment when she calls you with your pathology report.  Expect your pathology report 2-3 business days after your surgery.  You may call to check if you do not hear from us after three days. °10. OTHER INSTRUCTIONS: _______________________________________________________________________________________________ _____________________________________________________________________________________________________________________________________ °_____________________________________________________________________________________________________________________________________ °_____________________________________________________________________________________________________________________________________ ° °WHEN TO CALL YOUR DOCTOR: °1. Fever over 101.0 °2. Nausea and/or vomiting. °3. Extreme swelling or bruising. °4. Continued bleeding from incision. °5. Increased pain, redness, or drainage from the incision. ° °The clinic staff is available to answer your questions during regular business hours.  Please don’t hesitate to call and ask to speak to one of the nurses for clinical concerns.  If you have a medical emergency, go to the nearest  emergency room or call 911.  A surgeon from [facility] is always on call at the hospital. ° °For further questions, please visit [email or web address]  ° ° °  Post Anesthesia Home Care Instructions ° °Activity: °Get plenty of rest for the remainder of the day. A responsible adult should stay with you for 24 hours following the procedure.  °For the next 24 hours, DO NOT: °-Drive a car °-Operate machinery °-Drink alcoholic beverages °-Take any medication unless instructed by your physician °-Make any legal decisions or sign important papers. ° °Meals: °Start with liquid foods such as gelatin or soup. Progress to regular foods as tolerated. Avoid greasy, spicy, heavy foods. If nausea and/or vomiting occur, drink only clear liquids until the nausea and/or vomiting subsides. Call your physician if vomiting continues. ° °Special Instructions/Symptoms: °Your throat may feel dry or sore from the anesthesia or the breathing tube placed in your throat during surgery. If this causes discomfort, gargle with warm salt water. The discomfort should disappear within 24 hours. ° °

## 2014-11-25 ENCOUNTER — Encounter (HOSPITAL_BASED_OUTPATIENT_CLINIC_OR_DEPARTMENT_OTHER): Payer: Self-pay | Admitting: General Surgery

## 2014-11-25 NOTE — Progress Notes (Signed)
Quick Note:  Inform patient of Pathology report,. 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 Good news. Will review in detail at post op OV.  hmi ______

## 2015-09-01 ENCOUNTER — Encounter: Payer: Medicare Other | Admitting: Physician Assistant

## 2015-09-29 ENCOUNTER — Encounter: Payer: Self-pay | Admitting: Physician Assistant

## 2015-09-29 ENCOUNTER — Ambulatory Visit (INDEPENDENT_AMBULATORY_CARE_PROVIDER_SITE_OTHER): Payer: Medicare Other | Admitting: Physician Assistant

## 2015-09-29 VITALS — BP 153/89 | HR 118 | Resp 18 | Wt 142.0 lb

## 2015-09-29 DIAGNOSIS — G43109 Migraine with aura, not intractable, without status migrainosus: Secondary | ICD-10-CM | POA: Diagnosis not present

## 2015-09-29 DIAGNOSIS — G43009 Migraine without aura, not intractable, without status migrainosus: Secondary | ICD-10-CM | POA: Diagnosis not present

## 2015-09-29 DIAGNOSIS — R030 Elevated blood-pressure reading, without diagnosis of hypertension: Secondary | ICD-10-CM

## 2015-09-29 DIAGNOSIS — Z72820 Sleep deprivation: Secondary | ICD-10-CM

## 2015-09-29 DIAGNOSIS — IMO0001 Reserved for inherently not codable concepts without codable children: Secondary | ICD-10-CM | POA: Insufficient documentation

## 2015-09-29 DIAGNOSIS — Z7689 Persons encountering health services in other specified circumstances: Secondary | ICD-10-CM

## 2015-09-29 MED ORDER — RIZATRIPTAN BENZOATE 10 MG PO TABS: 10.0000 mg | ORAL_TABLET | ORAL | Status: DC | PRN

## 2015-09-29 MED ORDER — NAPROXEN 500 MG PO TABS: 500.0000 mg | ORAL_TABLET | Freq: Two times a day (BID) | ORAL | Status: DC

## 2015-09-29 MED ORDER — ATENOLOL 100 MG PO TABS: 100.0000 mg | ORAL_TABLET | Freq: Every day | ORAL | Status: DC

## 2015-09-29 MED ORDER — IMIPRAMINE HCL 50 MG PO TABS: 50.0000 mg | ORAL_TABLET | Freq: Every day | ORAL | Status: DC

## 2015-09-29 NOTE — Patient Instructions (Signed)

## 2015-09-29 NOTE — Progress Notes (Signed)
Patient ID: Katelyn Moore, female   DOB: 1940-12-20, 75 y.o.   MRN: [id number] History:  Katelyn Moore is a 75 y.o.  who presents to clinic today for follow up of migraine headaches as well as sleep difficulty.  They have been worse over the last month with large swings in weather. Starts left temple.  Generally moderate severity but sometimes severe.  There is throbbing.  Worse with movement, photophobia, phonophobia.  She has nausea and very occasional vomiting.    Triggers include weather changes, red wine.  Has had an aura before but this does not always happen.  Half of visual field was black.  She occasionally has flashing lights.   No improvement since menopause Aerobics three times weekly.  She takes Imipramine for migraine prevention and help with sleep.  She does not believe it helps.  ALthough last year she ran out for several days and her sleep was severely affected.  No problems with sleep as long as she continues on this medication now.     Last HA visit in January of 2016 patient reported abnormal EKG.  She has had multiple evaluations since then and no issues found.   Blood pressure has been good, always 135/85 or less.   Was on atenolol and HCTZ but recently changed Lisinopril because of a sodium problem.     HIT6:49 Number of days in the last 4 weeks with:  Severe headache: 4 Moderate headache: 3 Mild headache: 8  No headache: 13   Past Medical History  Diagnosis Date  . Headache(784.0)   . Hypertension   . Cancer ([place])     Skin cancer - basal cell removed  . Melanoma [facility])     Social History   Social History  . Marital Status: Married    Spouse Name: N/A  . Number of Children: N/A  . Years of Education: N/A   Occupational History  . Not on file.   Social History Main Topics  . Smoking status: Never Smoker   . Smokeless tobacco: Never Used  . Alcohol Use: Yes     Comment: Drinks rarely probably  1 glass of wine a year  . Drug Use: No  . Sexual  Activity: Not on file   Other Topics Concern  . Not on file   Social History Narrative    History reviewed. No pertinent family history.  Allergies  Allergen Reactions  . Codeine Other (See Comments)    headaches  . Tape Other (See Comments)    Blisters from tape from  Breast biopsy  . Morphine And Related Rash    Current Outpatient Prescriptions on File Prior to Visit  Medication Sig Dispense Refill  . Calcium Carb-Cholecalciferol (CALCIUM + D3 PO) Take 333 mg by mouth 3 (three) times daily before meals.    Marland Kitchen imipramine (TOFRANIL) 50 MG tablet Take 1 tablet (50 mg total) by mouth at bedtime. 30 tablet 11  . Omega-3 Fatty Acids (FISH OIL PO) Take 1,000 mg by mouth once.    . rizatriptan (MAXALT) 10 MG tablet Take 1 tablet (10 mg total) by mouth as needed for migraine. May repeat in 2 hours if needed 12 tablet 11   No current facility-administered medications on file prior to visit.     Review of Systems:  All pertinent positive/negative included in HPI, all other review of systems are negative  Objective:  Physical Exam BP 153/89 mmHg  Pulse 118  Resp 18  Wt 142 lb (64.411  kg) CONSTITUTIONAL: Well-developed, well-nourished female in no acute distress.  EYES: EOM intact ENT: Normocephalic CARDIOVASCULAR: Regular rate and rhythm with no adventitious sounds.  RESPIRATORY: Normal rate. Clear to auscultation bilaterally.  ENDOCRINE: Normal thyroid.  MUSCULOSKELETAL: Normal ROM, strength equal bilaterally, bilat cervical paraspinal muscle spasm noted SKIN: Warm, dry without erythema  NEUROLOGICAL: Alert, oriented, CN II-XII grossly intact, Appropriate balance PSYCH: Normal behavior, mood   Assessment & Plan:  Assessment: Migraine with aura - worsening Elevated blood pressure - worse  Plan: Restart atenolol.  Use 1/2 of 100mg  tab for a week and increase to full tab as tolerated Pt to call PCP today to discuss BP and BP medications.  Atenolol is migraine preventive  and prefer to keep pt on this medication if reasonable.   Continue Imipramine for migraine prevention/sleep aid  Maxalt - ONLY USE IF BP once again WELL CONTROLLED Naprosyn - treat acute migraine with this.  Treat early.  Okay to use with Maxalt once BP improved.   RTC 1 year or PRN.     Follow-up in 12 months or sooner PRN  Paticia Stack, PA-C 09/29/2015 1:37 PM

## 2015-11-13 DIAGNOSIS — R300 Dysuria: Secondary | ICD-10-CM | POA: Diagnosis not present

## 2015-11-25 DIAGNOSIS — Z803 Family history of malignant neoplasm of breast: Secondary | ICD-10-CM | POA: Diagnosis not present

## 2015-12-17 DIAGNOSIS — T148 Other injury of unspecified body region: Secondary | ICD-10-CM | POA: Diagnosis not present

## 2015-12-17 DIAGNOSIS — I781 Nevus, non-neoplastic: Secondary | ICD-10-CM | POA: Diagnosis not present

## 2015-12-17 DIAGNOSIS — Z85820 Personal history of malignant melanoma of skin: Secondary | ICD-10-CM | POA: Diagnosis not present

## 2015-12-17 DIAGNOSIS — Z23 Encounter for immunization: Secondary | ICD-10-CM | POA: Diagnosis not present

## 2015-12-17 DIAGNOSIS — L821 Other seborrheic keratosis: Secondary | ICD-10-CM | POA: Diagnosis not present

## 2015-12-17 DIAGNOSIS — L814 Other melanin hyperpigmentation: Secondary | ICD-10-CM | POA: Diagnosis not present

## 2015-12-17 DIAGNOSIS — D1800 Hemangioma unspecified site: Secondary | ICD-10-CM | POA: Diagnosis not present

## 2015-12-17 DIAGNOSIS — D225 Melanocytic nevi of trunk: Secondary | ICD-10-CM | POA: Diagnosis not present

## 2015-12-17 DIAGNOSIS — Z85828 Personal history of other malignant neoplasm of skin: Secondary | ICD-10-CM | POA: Diagnosis not present

## 2015-12-17 DIAGNOSIS — L309 Dermatitis, unspecified: Secondary | ICD-10-CM | POA: Diagnosis not present

## 2016-01-31 ENCOUNTER — Other Ambulatory Visit: Payer: Self-pay | Admitting: Physician Assistant

## 2016-02-02 DIAGNOSIS — I10 Essential (primary) hypertension: Secondary | ICD-10-CM | POA: Diagnosis not present

## 2016-02-02 DIAGNOSIS — R739 Hyperglycemia, unspecified: Secondary | ICD-10-CM | POA: Diagnosis not present

## 2016-02-02 DIAGNOSIS — R7303 Prediabetes: Secondary | ICD-10-CM | POA: Diagnosis not present

## 2016-03-29 DIAGNOSIS — H52223 Regular astigmatism, bilateral: Secondary | ICD-10-CM | POA: Diagnosis not present

## 2016-03-29 DIAGNOSIS — H5203 Hypermetropia, bilateral: Secondary | ICD-10-CM | POA: Diagnosis not present

## 2016-03-29 DIAGNOSIS — H2513 Age-related nuclear cataract, bilateral: Secondary | ICD-10-CM | POA: Diagnosis not present

## 2016-03-31 DIAGNOSIS — I10 Essential (primary) hypertension: Secondary | ICD-10-CM | POA: Diagnosis not present

## 2016-07-13 DIAGNOSIS — Z23 Encounter for immunization: Secondary | ICD-10-CM | POA: Diagnosis not present

## 2016-08-25 DIAGNOSIS — R8290 Unspecified abnormal findings in urine: Secondary | ICD-10-CM | POA: Diagnosis not present

## 2016-08-25 DIAGNOSIS — R05 Cough: Secondary | ICD-10-CM | POA: Diagnosis not present

## 2016-08-25 DIAGNOSIS — Z0000 Encounter for general adult medical examination without abnormal findings: Secondary | ICD-10-CM | POA: Diagnosis not present

## 2016-08-25 DIAGNOSIS — C439 Malignant melanoma of skin, unspecified: Secondary | ICD-10-CM | POA: Diagnosis not present

## 2016-08-25 DIAGNOSIS — D249 Benign neoplasm of unspecified breast: Secondary | ICD-10-CM | POA: Diagnosis not present

## 2016-08-25 DIAGNOSIS — M810 Age-related osteoporosis without current pathological fracture: Secondary | ICD-10-CM | POA: Diagnosis not present

## 2016-08-25 DIAGNOSIS — I10 Essential (primary) hypertension: Secondary | ICD-10-CM | POA: Diagnosis not present

## 2016-08-25 DIAGNOSIS — K219 Gastro-esophageal reflux disease without esophagitis: Secondary | ICD-10-CM | POA: Diagnosis not present

## 2016-08-25 DIAGNOSIS — E782 Mixed hyperlipidemia: Secondary | ICD-10-CM | POA: Diagnosis not present

## 2016-08-25 DIAGNOSIS — R7303 Prediabetes: Secondary | ICD-10-CM | POA: Diagnosis not present

## 2016-09-27 ENCOUNTER — Encounter: Payer: Medicare Other | Admitting: Physician Assistant

## 2016-09-28 DIAGNOSIS — H43813 Vitreous degeneration, bilateral: Secondary | ICD-10-CM | POA: Diagnosis not present

## 2016-09-28 DIAGNOSIS — H52223 Regular astigmatism, bilateral: Secondary | ICD-10-CM | POA: Diagnosis not present

## 2016-09-28 DIAGNOSIS — I10 Essential (primary) hypertension: Secondary | ICD-10-CM | POA: Diagnosis not present

## 2016-09-28 DIAGNOSIS — H5203 Hypermetropia, bilateral: Secondary | ICD-10-CM | POA: Diagnosis not present

## 2016-09-28 DIAGNOSIS — H35033 Hypertensive retinopathy, bilateral: Secondary | ICD-10-CM | POA: Diagnosis not present

## 2016-09-28 DIAGNOSIS — H25813 Combined forms of age-related cataract, bilateral: Secondary | ICD-10-CM | POA: Diagnosis not present

## 2016-10-12 DIAGNOSIS — Z1211 Encounter for screening for malignant neoplasm of colon: Secondary | ICD-10-CM | POA: Diagnosis not present

## 2016-10-15 ENCOUNTER — Other Ambulatory Visit: Payer: Self-pay | Admitting: Physician Assistant

## 2016-10-15 DIAGNOSIS — G43009 Migraine without aura, not intractable, without status migrainosus: Secondary | ICD-10-CM

## 2016-10-21 ENCOUNTER — Ambulatory Visit (INDEPENDENT_AMBULATORY_CARE_PROVIDER_SITE_OTHER): Payer: Medicare Other | Admitting: Physician Assistant

## 2016-10-21 ENCOUNTER — Encounter: Payer: Self-pay | Admitting: Physician Assistant

## 2016-10-21 DIAGNOSIS — G43009 Migraine without aura, not intractable, without status migrainosus: Secondary | ICD-10-CM | POA: Diagnosis not present

## 2016-10-21 MED ORDER — IMIPRAMINE HCL 50 MG PO TABS: 50.0000 mg | ORAL_TABLET | Freq: Every day | ORAL | 11 refills | Status: DC

## 2016-10-21 MED ORDER — RIZATRIPTAN BENZOATE 10 MG PO TABS: 10.0000 mg | ORAL_TABLET | ORAL | 11 refills | Status: DC | PRN

## 2016-10-21 NOTE — Patient Instructions (Signed)
Migraine Headache A migraine headache is an intense, throbbing pain on one side or both sides of the head. Migraines may also cause other symptoms, such as nausea, vomiting, and sensitivity to light and noise. What are the causes? Doing or taking certain things may also trigger migraines, such as:  Alcohol.  Smoking.  Medicines, such as: ? Medicine used to treat chest pain (nitroglycerine). ? Birth control pills. ? Estrogen pills. ? Certain blood pressure medicines.  Aged cheeses, chocolate, or caffeine.  Foods or drinks that contain nitrates, glutamate, aspartame, or tyramine.  Physical activity.  Other things that may trigger a migraine include:  Menstruation.  Pregnancy.  Hunger.  Stress, lack of sleep, too much sleep, or fatigue.  Weather changes.  What increases the risk? The following factors may make you more likely to experience migraine headaches:  Age. Risk increases with age.  Family history of migraine headaches.  Being Caucasian.  Depression and anxiety.  Obesity.  Being a woman.  Having a hole in the heart (patent foramen ovale) or other heart problems.  What are the signs or symptoms? The main symptom of this condition is pulsating or throbbing pain. Pain may:  Happen in any area of the head, such as on one side or both sides.  Interfere with daily activities.  Get worse with physical activity.  Get worse with exposure to bright lights or loud noises.  Other symptoms may include:  Nausea.  Vomiting.  Dizziness.  General sensitivity to bright lights, loud noises, or smells.  Before you get a migraine, you may get warning signs that a migraine is developing (aura). An aura may include:  Seeing flashing lights or having blind spots.  Seeing bright spots, halos, or zigzag lines.  Having tunnel vision or blurred vision.  Having numbness or a tingling feeling.  Having trouble talking.  Having muscle weakness.  How is this  diagnosed? A migraine headache can be diagnosed based on:  Your symptoms.  A physical exam.  Tests, such as CT scan or MRI of the head. These imaging tests can help rule out other causes of headaches.  Taking fluid from the spine (lumbar puncture) and analyzing it (cerebrospinal fluid analysis, or CSF analysis).  How is this treated? A migraine headache is usually treated with medicines that:  Relieve pain.  Relieve nausea.  Prevent migraines from coming back.  Treatment may also include:  Acupuncture.  Lifestyle changes like avoiding foods that trigger migraines.  Follow these instructions at home: Medicines  Take over-the-counter and prescription medicines only as told by your health care provider.  Do not drive or use heavy machinery while taking prescription pain medicine.  To prevent or treat constipation while you are taking prescription pain medicine, your health care provider may recommend that you: ? Drink enough fluid to keep your urine clear or pale yellow. ? Take over-the-counter or prescription medicines. ? Eat foods that are high in fiber, such as fresh fruits and vegetables, whole grains, and beans. ? Limit foods that are high in fat and processed sugars, such as fried and sweet foods. Lifestyle  Avoid alcohol use.  Do not use any products that contain nicotine or tobacco, such as cigarettes and e-cigarettes. If you need help quitting, ask your health care provider.  Get at least 8 hours of sleep every night.  Limit your stress. General instructions   Keep a journal to find out what may trigger your migraine headaches. For example, write down: ? What you eat and   drink. ? How much sleep you get. ? Any change to your diet or medicines.  If you have a migraine: ? Avoid things that make your symptoms worse, such as bright lights. ? It may help to lie down in a dark, quiet room. ? Do not drive or use heavy machinery. ? Ask your health care provider  what activities are safe for you while you are experiencing symptoms.  Keep all follow-up visits as told by your health care provider. This is important. Contact a health care provider if:  You develop symptoms that are different or more severe than your usual migraine symptoms. Get help right away if:  Your migraine becomes severe.  You have a fever.  You have a stiff neck.  You have vision loss.  Your muscles feel weak or like you cannot control them.  You start to lose your balance often.  You develop trouble walking.  You faint. This information is not intended to replace advice given to you by your health care provider. Make sure you discuss any questions you have with your health care provider. Document Released: 09/05/2005 Document Revised: 03/25/2016 Document Reviewed: 02/22/2016 Elsevier Interactive Patient Education  2017 [employer]   

## 2016-10-21 NOTE — Progress Notes (Signed)
History:  Katelyn Moore is a 76 y.o. who presents to clinic today for yearly follow up of headaches.  She is much improved since her last visit.  She is stable on 50mg  imipramine nightly as well as a new combo htn medication containing atenolol.  Both her HA's and her HTN are significantly improved.  No new medical issues.   No aura with migraine in years.   Number of days in the last 4 weeks with:  Severe headache: 0 Moderate headache: 0 Mild headache: 5  No headache: 23   Past Medical History:  Diagnosis Date  . Cancer ([place])    Skin cancer - basal cell removed  . Headache(784.0)   . Hypertension   . Melanoma [facility])     Social History   Social History  . Marital status: Married    Spouse name: N/A  . Number of children: N/A  . Years of education: N/A   Occupational History  . Not on file.   Social History Main Topics  . Smoking status: Never Smoker  . Smokeless tobacco: Never Used  . Alcohol use Yes     Comment: Drinks rarely probably  1 glass of wine a year  . Drug use: No  . Sexual activity: Not on file   Other Topics Concern  . Not on file   Social History Narrative  . No narrative on file    No family history on file.  Allergies  Allergen Reactions  . Codeine Other (See Comments)    headaches  . Tape Other (See Comments)    Blisters from tape from  Breast biopsy  . Morphine And Related Rash    Current Outpatient Prescriptions on File Prior to Visit  Medication Sig Dispense Refill  . Calcium Carb-Cholecalciferol (CALCIUM + D3 PO) Take 333 mg by mouth 3 (three) times daily before meals.    Marland Kitchen imipramine (TOFRANIL) 50 MG tablet Take 1 tablet (50 mg total) by mouth at bedtime. 30 tablet 11  . naproxen (NAPROSYN) 500 MG tablet Take 1 tablet (500 mg total) by mouth 2 (two) times daily with a meal. 30 tablet 0  . Omega-3 Fatty Acids (FISH OIL PO) Take 1,000 mg by mouth once.    . rizatriptan (MAXALT) 10 MG tablet Take 1 tablet (10 mg total) by mouth as  needed for migraine. May repeat in 2 hours if needed 12 tablet 11   No current facility-administered medications on file prior to visit.      Review of Systems:  All pertinent positive/negative included in HPI, all other review of systems are negative   Objective:  Physical Exam BP 107/72   Pulse 72   Ht 5' (1.524 m)   Wt 131 lb (59.4 kg)   BMI 25.58 kg/m  CONSTITUTIONAL: Well-developed, well-nourished female in no acute distress.  EYES: EOM intact ENT: Normocephalic CARDIOVASCULAR: Regular rate  RESPIRATORY: Normal rate.  MUSCULOSKELETAL: Normal ROM SKIN: Warm, dry without erythema  NEUROLOGICAL: Alert, oriented, CN II-XII grossly intact, Appropriate balance PSYCH: Normal behavior, mood   Assessment & Plan:  Assessment: Migraine without aura  Plan: Continue nightly imipramine for prevention Continue atenolol for HTN and migraine prevention provided by PCP Maxalt for acute HA Continue regular aerobics Follow-up in 12 months or sooner PRN  Paticia Stack, PA-C 10/21/2016 11:27 AM

## 2016-12-06 DIAGNOSIS — R928 Other abnormal and inconclusive findings on diagnostic imaging of breast: Secondary | ICD-10-CM | POA: Diagnosis not present

## 2016-12-20 DIAGNOSIS — S30860A Insect bite (nonvenomous) of lower back and pelvis, initial encounter: Secondary | ICD-10-CM | POA: Diagnosis not present

## 2016-12-20 DIAGNOSIS — Z85820 Personal history of malignant melanoma of skin: Secondary | ICD-10-CM | POA: Diagnosis not present

## 2016-12-20 DIAGNOSIS — L814 Other melanin hyperpigmentation: Secondary | ICD-10-CM | POA: Diagnosis not present

## 2016-12-20 DIAGNOSIS — Z85828 Personal history of other malignant neoplasm of skin: Secondary | ICD-10-CM | POA: Diagnosis not present

## 2016-12-20 DIAGNOSIS — D485 Neoplasm of uncertain behavior of skin: Secondary | ICD-10-CM | POA: Diagnosis not present

## 2017-01-16 DIAGNOSIS — Z01411 Encounter for gynecological examination (general) (routine) with abnormal findings: Secondary | ICD-10-CM | POA: Diagnosis not present

## 2017-01-16 DIAGNOSIS — N816 Rectocele: Secondary | ICD-10-CM | POA: Diagnosis not present

## 2017-03-15 DIAGNOSIS — H5203 Hypermetropia, bilateral: Secondary | ICD-10-CM | POA: Diagnosis not present

## 2017-03-15 DIAGNOSIS — H2513 Age-related nuclear cataract, bilateral: Secondary | ICD-10-CM | POA: Diagnosis not present

## 2017-03-15 DIAGNOSIS — H52223 Regular astigmatism, bilateral: Secondary | ICD-10-CM | POA: Diagnosis not present

## 2017-03-16 DIAGNOSIS — I10 Essential (primary) hypertension: Secondary | ICD-10-CM | POA: Diagnosis not present

## 2017-03-16 DIAGNOSIS — E782 Mixed hyperlipidemia: Secondary | ICD-10-CM | POA: Diagnosis not present

## 2017-03-16 DIAGNOSIS — R7303 Prediabetes: Secondary | ICD-10-CM | POA: Diagnosis not present

## 2017-03-16 DIAGNOSIS — M719 Bursopathy, unspecified: Secondary | ICD-10-CM | POA: Diagnosis not present

## 2017-07-13 DIAGNOSIS — Z23 Encounter for immunization: Secondary | ICD-10-CM | POA: Diagnosis not present

## 2017-09-20 ENCOUNTER — Encounter: Payer: Self-pay | Admitting: Radiology

## 2017-10-06 ENCOUNTER — Encounter: Payer: Self-pay | Admitting: Physician Assistant

## 2017-10-06 ENCOUNTER — Ambulatory Visit: Payer: Medicare Other | Admitting: Physician Assistant

## 2017-10-06 VITALS — BP 111/68 | HR 69 | Ht 60.25 in | Wt 132.0 lb

## 2017-10-06 DIAGNOSIS — Z7689 Persons encountering health services in other specified circumstances: Secondary | ICD-10-CM

## 2017-10-06 DIAGNOSIS — G43009 Migraine without aura, not intractable, without status migrainosus: Secondary | ICD-10-CM

## 2017-10-06 MED ORDER — RIZATRIPTAN BENZOATE 10 MG PO TABS: 10.0000 mg | ORAL_TABLET | ORAL | 11 refills | Status: DC | PRN

## 2017-10-06 MED ORDER — IMIPRAMINE HCL 50 MG PO TABS: 50.0000 mg | ORAL_TABLET | Freq: Every day | ORAL | 12 refills | Status: DC

## 2017-10-06 NOTE — Progress Notes (Signed)
History:  Katelyn Moore is a 77 y.o. who presents to clinic today for HA followup.  She is here for yearly followup.  She takes imipramine and an atenolol blood pressure medication that both work well to prevent migraines.  She is hesitant to make changes to her regimen as it is working so well.  No change in migraine symptoms.  No change in medical history.  Life is running smoothly for her at present.   Number of days in the last 4 weeks with:  Severe headache: 0 Moderate headache: 0 Mild headache: 6  No headache: 22   Past Medical History:  Diagnosis Date  . Cancer ([place])    Skin cancer - basal cell removed  . Headache(784.0)   . Hypertension   . Melanoma [facility])     Social History   Socioeconomic History  . Marital status: Married    Spouse name: Not on file  . Number of children: Not on file  . Years of education: Not on file  . Highest education level: Not on file  Social Needs  . Financial resource strain: Not on file  . Food insecurity - worry: Not on file  . Food insecurity - inability: Not on file  . Transportation needs - medical: Not on file  . Transportation needs - non-medical: Not on file  Occupational History  . Not on file  Tobacco Use  . Smoking status: Never Smoker  . Smokeless tobacco: Never Used  Substance and Sexual Activity  . Alcohol use: Yes    Comment: Drinks rarely probably  1 glass of wine a year  . Drug use: No  . Sexual activity: Not on file  Other Topics Concern  . Not on file  Social History Narrative  . Not on file    History reviewed. No pertinent family history.  Allergies  Allergen Reactions  . Codeine Other (See Comments)    headaches  . Tape Other (See Comments)    Blisters from tape from  Breast biopsy  . Morphine And Related Rash    Current Outpatient Medications on File Prior to Visit  Medication Sig Dispense Refill  . atenolol-chlorthalidone (TENORETIC) 100-25 MG tablet     . Calcium Carb-Cholecalciferol  (CALCIUM + D3 PO) Take 333 mg by mouth 3 (three) times daily before meals.    Marland Kitchen imipramine (TOFRANIL) 50 MG tablet Take 1 tablet (50 mg total) by mouth at bedtime. 30 tablet 11  . Omega-3 Fatty Acids (FISH OIL PO) Take 1,000 mg by mouth once.    . rizatriptan (MAXALT) 10 MG tablet Take 1 tablet (10 mg total) by mouth as needed for migraine. May repeat in 2 hours if needed 12 tablet 11  . TURMERIC PO Take by mouth.     No current facility-administered medications on file prior to visit.      Review of Systems:  All pertinent positive/negative included in HPI, all other review of systems are negative   Objective:  Physical Exam BP 111/68   Pulse 69   Ht 5' 0.25" (1.53 m)   Wt 132 lb (59.9 kg)   BMI 25.57 kg/m  CONSTITUTIONAL: Well-developed, well-nourished female in no acute distress.  EYES: EOM intact ENT: Normocephalic CARDIOVASCULAR: Regular rate  RESPIRATORY: Normal rate.  MUSCULOSKELETAL: Normal ROM SKIN: Warm, dry without erythema  NEUROLOGICAL: Alert, oriented, CN II-XII grossly intact, Appropriate balance PSYCH: Normal behavior, mood   Assessment & Plan:  Assessment: 1. Sleep concern   2. Migraine without aura  and without status migrainosus, not intractable      Plan: Continue imipramine for sleep and migraine prevention Continue maxalt for acute treatment of migraine Continue atenolol for prevention of migraine and elevated BP (given by PCP)  Follow-up in 12 months or sooner PRN  Paticia Stack, PA-C 10/06/2017 9:50 AM

## 2017-10-07 ENCOUNTER — Other Ambulatory Visit: Payer: Self-pay | Admitting: Physician Assistant

## 2017-10-07 DIAGNOSIS — G43009 Migraine without aura, not intractable, without status migrainosus: Secondary | ICD-10-CM

## 2017-10-10 DIAGNOSIS — M810 Age-related osteoporosis without current pathological fracture: Secondary | ICD-10-CM | POA: Diagnosis not present

## 2017-10-10 DIAGNOSIS — I10 Essential (primary) hypertension: Secondary | ICD-10-CM | POA: Diagnosis not present

## 2017-10-10 DIAGNOSIS — Z0000 Encounter for general adult medical examination without abnormal findings: Secondary | ICD-10-CM | POA: Diagnosis not present

## 2017-10-10 DIAGNOSIS — E782 Mixed hyperlipidemia: Secondary | ICD-10-CM | POA: Diagnosis not present

## 2017-10-13 DIAGNOSIS — Z0000 Encounter for general adult medical examination without abnormal findings: Secondary | ICD-10-CM | POA: Diagnosis not present

## 2017-10-18 DIAGNOSIS — H524 Presbyopia: Secondary | ICD-10-CM | POA: Diagnosis not present

## 2017-10-18 DIAGNOSIS — H52223 Regular astigmatism, bilateral: Secondary | ICD-10-CM | POA: Diagnosis not present

## 2017-10-18 DIAGNOSIS — H25813 Combined forms of age-related cataract, bilateral: Secondary | ICD-10-CM | POA: Diagnosis not present

## 2017-10-18 DIAGNOSIS — H5203 Hypermetropia, bilateral: Secondary | ICD-10-CM | POA: Diagnosis not present

## 2017-12-06 DIAGNOSIS — Z1231 Encounter for screening mammogram for malignant neoplasm of breast: Secondary | ICD-10-CM | POA: Diagnosis not present

## 2017-12-20 DIAGNOSIS — L814 Other melanin hyperpigmentation: Secondary | ICD-10-CM | POA: Diagnosis not present

## 2017-12-20 DIAGNOSIS — D225 Melanocytic nevi of trunk: Secondary | ICD-10-CM | POA: Diagnosis not present

## 2017-12-20 DIAGNOSIS — Z85828 Personal history of other malignant neoplasm of skin: Secondary | ICD-10-CM | POA: Diagnosis not present

## 2017-12-20 DIAGNOSIS — Z85820 Personal history of malignant melanoma of skin: Secondary | ICD-10-CM | POA: Diagnosis not present

## 2018-04-10 DIAGNOSIS — R7303 Prediabetes: Secondary | ICD-10-CM | POA: Diagnosis not present

## 2018-04-10 DIAGNOSIS — I10 Essential (primary) hypertension: Secondary | ICD-10-CM | POA: Diagnosis not present

## 2018-04-10 DIAGNOSIS — E782 Mixed hyperlipidemia: Secondary | ICD-10-CM | POA: Diagnosis not present

## 2018-04-10 DIAGNOSIS — R109 Unspecified abdominal pain: Secondary | ICD-10-CM | POA: Diagnosis not present

## 2018-04-11 DIAGNOSIS — R1032 Left lower quadrant pain: Secondary | ICD-10-CM | POA: Diagnosis not present

## 2018-04-11 DIAGNOSIS — R1310 Dysphagia, unspecified: Secondary | ICD-10-CM | POA: Diagnosis not present

## 2018-05-08 DIAGNOSIS — R1310 Dysphagia, unspecified: Secondary | ICD-10-CM | POA: Diagnosis not present

## 2018-05-08 DIAGNOSIS — R1032 Left lower quadrant pain: Secondary | ICD-10-CM | POA: Diagnosis not present

## 2018-08-01 ENCOUNTER — Telehealth: Payer: Self-pay | Admitting: Radiology

## 2018-08-01 NOTE — Telephone Encounter (Signed)
Left message for patient to call [id number] to schedule appointment with headache specialist in January.

## 2018-10-05 ENCOUNTER — Ambulatory Visit: Payer: Medicare Other | Admitting: Physician Assistant

## 2018-10-05 ENCOUNTER — Encounter: Payer: Self-pay | Admitting: Physician Assistant

## 2018-10-05 DIAGNOSIS — G43009 Migraine without aura, not intractable, without status migrainosus: Secondary | ICD-10-CM

## 2018-10-05 DIAGNOSIS — H25813 Combined forms of age-related cataract, bilateral: Secondary | ICD-10-CM | POA: Diagnosis not present

## 2018-10-05 DIAGNOSIS — H5203 Hypermetropia, bilateral: Secondary | ICD-10-CM | POA: Diagnosis not present

## 2018-10-05 DIAGNOSIS — L570 Actinic keratosis: Secondary | ICD-10-CM | POA: Diagnosis not present

## 2018-10-05 DIAGNOSIS — H01012 Ulcerative blepharitis right lower eyelid: Secondary | ICD-10-CM | POA: Diagnosis not present

## 2018-10-05 MED ORDER — RIZATRIPTAN BENZOATE 10 MG PO TABS: 10.0000 mg | ORAL_TABLET | ORAL | 11 refills | Status: DC | PRN

## 2018-10-05 MED ORDER — IMIPRAMINE HCL 25 MG PO TABS: 50.0000 mg | ORAL_TABLET | Freq: Every day | ORAL | 3 refills | Status: DC

## 2018-10-05 NOTE — Patient Instructions (Signed)

## 2018-10-05 NOTE — Progress Notes (Signed)
History:  Katelyn Moore is a 78 y.o. No obstetric history on file. who presents to clinic today for yearly headache management.  Most headaches come from weather changes.  Her abortive medicine works within 30 minutes and never gets severe.  She notes her former triggers were smoke and colognes which she is rarely exposed to.  When she does get a headache, she will have several days of headaches and then go a week or more with none.  She does have dry mouth.    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 Cancer ([place])    Skin cancer - basal cell removed  . Headache(784.0)   . Hypertension   . Melanoma [facility])     Social History   Socioeconomic History  . Marital status: Married    Spouse name: Not on file  . Number of children: Not on file  . Years of education: Not on file  . Highest education level: Not on file  Occupational History  . Not on file  Social Needs  . Financial resource strain: Not on file  . Food insecurity:    Worry: Not on file    Inability: Not on file  . Transportation needs:    Medical: Not on file    Non-medical: Not on file  Tobacco Use  . Smoking status: Never Smoker  . Smokeless tobacco: Never Used  Substance and Sexual Activity  . Alcohol use: Yes    Comment: Drinks rarely probably  1 glass of wine a year  . Drug use: No  . Sexual activity: Not on file  Lifestyle  . Physical activity:    Days per week: Not on file    Minutes per session: Not on file  . Stress: Not on file  Relationships  . Social connections:    Talks on phone: Not on file    Gets together: Not on file    Attends religious service: Not on file    Active member of club or organization: Not on file    Attends meetings of clubs or organizations: Not on file    Relationship status: Not on file  . Intimate partner violence:    Fear of current or ex partner: Not on file    Emotionally abused: Not on file    Physically abused: Not on file    Forced sexual  activity: Not on file  Other Topics Concern  . Not on file  Social History Narrative  . Not on file    History reviewed. No pertinent family history.  Allergies  Allergen Reactions  . Codeine Other (See Comments)    headaches  . Tape Other (See Comments)    Blisters from tape from  Breast biopsy  . Morphine And Related Rash    Current Outpatient Medications on File Prior to Visit  Medication Sig Dispense Refill  . atenolol-chlorthalidone (TENORETIC) 100-25 MG tablet     . Calcium Carb-Cholecalciferol (CALCIUM + D3 PO) Take 333 mg by mouth 3 (three) times daily before meals.    Marland Kitchen imipramine (TOFRANIL) 50 MG tablet Take 1 tablet (50 mg total) by mouth at bedtime. 30 tablet 12  . imipramine (TOFRANIL) 50 MG tablet TAKE 1 TABLET(50 MG) BY MOUTH AT BEDTIME 30 tablet 0  . magnesium 30 MG tablet Take 30 mg by mouth 2 (two) times daily.    . Omega-3 Fatty Acids (FISH OIL PO) Take 1,000 mg by mouth once.    . rizatriptan (MAXALT)  10 MG tablet Take 1 tablet (10 mg total) by mouth as needed for migraine. May repeat in 2 hours if needed 12 tablet 11  . TURMERIC PO Take by mouth.    . vitamin B-12 (CYANOCOBALAMIN) 100 MCG tablet Take 100 mcg by mouth daily.     No current facility-administered medications on file prior to visit.      Review of Systems:  All pertinent positive/negative included in HPI, all other review of systems are negative   Objective:  Physical Exam BP 122/79   Pulse 64   Ht 5\' 1"  (1.549 m)   Wt 132 lb (59.9 kg)   BMI 24.94 kg/m  CONSTITUTIONAL: Well-developed, well-nourished female in no acute distress.  EYES: EOM intact ENT: Normocephalic CARDIOVASCULAR: Regular rate   RESPIRATORY: Normal rate.  MUSCULOSKELETAL: Normal ROM SKIN: Warm, dry without erythema  NEUROLOGICAL: Alert, oriented, CN II-XII grossly intact, Appropriate balance   PSYCH: Normal behavior, mood   Assessment & Plan:  Assessment: 1. Migraine without aura and without status  migrainosus, not intractable      Plan: Trial of decreasing imipramine to 25mg  nightly.  If HA worsens, increase back to 50mg  qhs.   Continue maxalt for acute pain Continue regular exercise Follow-up in 12 months or sooner PRN  Paticia Stack, PA-C 10/05/2018 8:35 AM

## 2019-03-20 DIAGNOSIS — Z1231 Encounter for screening mammogram for malignant neoplasm of breast: Secondary | ICD-10-CM | POA: Diagnosis not present

## 2019-03-20 DIAGNOSIS — I10 Essential (primary) hypertension: Secondary | ICD-10-CM | POA: Diagnosis not present

## 2019-03-20 DIAGNOSIS — R7303 Prediabetes: Secondary | ICD-10-CM | POA: Diagnosis not present

## 2019-03-20 DIAGNOSIS — Z0000 Encounter for general adult medical examination without abnormal findings: Secondary | ICD-10-CM | POA: Diagnosis not present

## 2019-03-20 DIAGNOSIS — E782 Mixed hyperlipidemia: Secondary | ICD-10-CM | POA: Diagnosis not present

## 2019-03-26 DIAGNOSIS — N816 Rectocele: Secondary | ICD-10-CM | POA: Diagnosis not present

## 2019-03-26 DIAGNOSIS — Z01419 Encounter for gynecological examination (general) (routine) without abnormal findings: Secondary | ICD-10-CM | POA: Diagnosis not present

## 2019-03-26 DIAGNOSIS — N8111 Cystocele, midline: Secondary | ICD-10-CM | POA: Diagnosis not present

## 2019-04-02 DIAGNOSIS — E782 Mixed hyperlipidemia: Secondary | ICD-10-CM | POA: Diagnosis not present

## 2019-04-02 DIAGNOSIS — I10 Essential (primary) hypertension: Secondary | ICD-10-CM | POA: Diagnosis not present

## 2019-04-02 DIAGNOSIS — R7303 Prediabetes: Secondary | ICD-10-CM | POA: Diagnosis not present

## 2019-04-06 DIAGNOSIS — Z780 Asymptomatic menopausal state: Secondary | ICD-10-CM | POA: Diagnosis not present

## 2019-04-06 DIAGNOSIS — Z90710 Acquired absence of both cervix and uterus: Secondary | ICD-10-CM | POA: Diagnosis not present

## 2019-04-06 DIAGNOSIS — M8589 Other specified disorders of bone density and structure, multiple sites: Secondary | ICD-10-CM | POA: Diagnosis not present

## 2019-04-06 DIAGNOSIS — R29890 Loss of height: Secondary | ICD-10-CM | POA: Diagnosis not present

## 2019-05-06 DIAGNOSIS — H02889 Meibomian gland dysfunction of unspecified eye, unspecified eyelid: Secondary | ICD-10-CM | POA: Diagnosis not present

## 2019-05-06 DIAGNOSIS — H532 Diplopia: Secondary | ICD-10-CM | POA: Diagnosis not present

## 2019-05-06 DIAGNOSIS — H2513 Age-related nuclear cataract, bilateral: Secondary | ICD-10-CM | POA: Diagnosis not present

## 2019-06-01 DIAGNOSIS — Z23 Encounter for immunization: Secondary | ICD-10-CM | POA: Diagnosis not present

## 2019-06-19 DIAGNOSIS — Z85820 Personal history of malignant melanoma of skin: Secondary | ICD-10-CM | POA: Diagnosis not present

## 2019-06-19 DIAGNOSIS — L814 Other melanin hyperpigmentation: Secondary | ICD-10-CM | POA: Diagnosis not present

## 2019-06-19 DIAGNOSIS — D225 Melanocytic nevi of trunk: Secondary | ICD-10-CM | POA: Diagnosis not present

## 2019-06-19 DIAGNOSIS — Z85828 Personal history of other malignant neoplasm of skin: Secondary | ICD-10-CM | POA: Diagnosis not present

## 2019-08-06 DIAGNOSIS — H02889 Meibomian gland dysfunction of unspecified eye, unspecified eyelid: Secondary | ICD-10-CM | POA: Diagnosis not present

## 2019-08-06 DIAGNOSIS — H029 Unspecified disorder of eyelid: Secondary | ICD-10-CM | POA: Diagnosis not present

## 2019-08-06 DIAGNOSIS — H532 Diplopia: Secondary | ICD-10-CM | POA: Diagnosis not present

## 2019-08-28 ENCOUNTER — Encounter: Payer: Self-pay | Admitting: Radiology

## 2019-09-24 DIAGNOSIS — M25519 Pain in unspecified shoulder: Secondary | ICD-10-CM | POA: Diagnosis not present

## 2019-09-24 DIAGNOSIS — I10 Essential (primary) hypertension: Secondary | ICD-10-CM | POA: Diagnosis not present

## 2019-09-24 DIAGNOSIS — E782 Mixed hyperlipidemia: Secondary | ICD-10-CM | POA: Diagnosis not present

## 2019-09-24 DIAGNOSIS — R7303 Prediabetes: Secondary | ICD-10-CM | POA: Diagnosis not present

## 2019-09-27 ENCOUNTER — Encounter: Payer: Medicare Other | Admitting: Physician Assistant

## 2019-09-29 ENCOUNTER — Other Ambulatory Visit: Payer: Self-pay | Admitting: Physician Assistant

## 2019-11-01 ENCOUNTER — Encounter: Payer: Self-pay | Admitting: Physician Assistant

## 2019-11-01 ENCOUNTER — Ambulatory Visit (INDEPENDENT_AMBULATORY_CARE_PROVIDER_SITE_OTHER): Payer: Medicare Other | Admitting: Physician Assistant

## 2019-11-01 ENCOUNTER — Other Ambulatory Visit: Payer: Self-pay

## 2019-11-01 VITALS — BP 118/78 | HR 65 | Wt 131.0 lb

## 2019-11-01 DIAGNOSIS — I10 Essential (primary) hypertension: Secondary | ICD-10-CM | POA: Diagnosis not present

## 2019-11-01 DIAGNOSIS — G43009 Migraine without aura, not intractable, without status migrainosus: Secondary | ICD-10-CM | POA: Diagnosis not present

## 2019-11-01 DIAGNOSIS — T43015A Adverse effect of tricyclic antidepressants, initial encounter: Secondary | ICD-10-CM

## 2019-11-01 DIAGNOSIS — T887XXA Unspecified adverse effect of drug or medicament, initial encounter: Secondary | ICD-10-CM | POA: Insufficient documentation

## 2019-11-01 DIAGNOSIS — Z7982 Long term (current) use of aspirin: Secondary | ICD-10-CM

## 2019-11-01 DIAGNOSIS — Z79899 Other long term (current) drug therapy: Secondary | ICD-10-CM

## 2019-11-01 MED ORDER — NURTEC 75 MG PO TBDP: 75.0000 mg | ORAL_TABLET | ORAL | 11 refills | Status: DC | PRN

## 2019-11-01 MED ORDER — IMIPRAMINE HCL 10 MG PO TABS: 50.0000 mg | ORAL_TABLET | Freq: Every day | ORAL | 3 refills | Status: DC

## 2019-11-01 MED ORDER — RIZATRIPTAN BENZOATE 10 MG PO TABS: 10.0000 mg | ORAL_TABLET | ORAL | 11 refills | Status: DC | PRN

## 2019-11-01 NOTE — Progress Notes (Signed)
History:  Katelyn Moore is a 79 y.o. who presents to clinic today for headache.  She often wakes with a headache but notes that it improves with eating and exercise which she does every morning.   She tried decreasing the imipramine from 50mg  to 25mg  but found that she could not sleep.   She restarted the 50mg  and has continued.  She would like to be able to decrease the dry mouth it causes.    HIT6:50 Number of days in the last 4 weeks with:  Severe headache: 0 Moderate headache: 0 Mild headache: 4  No headache: 24   Past Medical History:  Diagnosis Date  . Cancer ([place])    Skin cancer - basal cell removed  . Headache(784.0)   . Hypertension   . Melanoma [facility])     Social History   Socioeconomic History  . Marital status: Married    Spouse name: Not on file  . Number of children: Not on file  . Years of education: Not on file  . Highest education level: Not on file  Occupational History  . Not on file  Tobacco Use  . Smoking status: Never Smoker  . Smokeless tobacco: Never Used  Substance and Sexual Activity  . Alcohol use: Yes    Comment: Drinks rarely probably  1 glass of wine a year  . Drug use: No  . Sexual activity: Not on file  Other Topics Concern  . Not on file  Social History Narrative  . Not on file   Social Determinants of Health   Financial Resource Strain:   . Difficulty of Paying Living Expenses: Not on file  Food Insecurity:   . Worried About Charity fundraiser in the Last Year: Not on file  . Ran Out of Food in the Last Year: Not on file  Transportation Needs:   . Lack of Transportation (Medical): Not on file  . Lack of Transportation (Non-Medical): Not on file  Physical Activity:   . Days of Exercise per Week: Not on file  . Minutes of Exercise per Session: Not on file  Stress:   . Feeling of Stress : Not on file  Social Connections:   . Frequency of Communication with Friends and Family: Not on file  . Frequency of Social Gatherings  with Friends and Family: Not on file  . Attends Religious Services: Not on file  . Active Member of Clubs or Organizations: Not on file  . Attends Archivist Meetings: Not on file  . Marital Status: Not on file  Intimate Partner Violence:   . Fear of Current or Ex-Partner: Not on file  . Emotionally Abused: Not on file  . Physically Abused: Not on file  . Sexually Abused: Not on file    History reviewed. No pertinent family history.  Allergies  Allergen Reactions  . Codeine Other (See Comments)    headaches  . Tape Other (See Comments)    Blisters from tape from  Breast biopsy  . Morphine And Related Rash    Current Outpatient Medications on File Prior to Visit  Medication Sig Dispense Refill  . aspirin EC 81 MG tablet Take 81 mg by mouth daily.    Marland Kitchen atenolol-chlorthalidone (TENORETIC) 100-25 MG tablet     . Calcium Carb-Cholecalciferol (CALCIUM + D3 PO) Take 333 mg by mouth 3 (three) times daily before meals.    Marland Kitchen imipramine (TOFRANIL) 25 MG tablet TAKE 2 TABLETS(50 MG) BY MOUTH AT BEDTIME 180  tablet 3  . magnesium 30 MG tablet Take 30 mg by mouth 2 (two) times daily.    . Omega-3 Fatty Acids (FISH OIL PO) Take 1,000 mg by mouth once.    . rizatriptan (MAXALT) 10 MG tablet Take 1 tablet (10 mg total) by mouth as needed for migraine. May repeat in 2 hours if needed 12 tablet 11  . TURMERIC PO Take by mouth.    . vitamin B-12 (CYANOCOBALAMIN) 100 MCG tablet Take 100 mcg by mouth daily.     No current facility-administered medications on file prior to visit.     Review of Systems:  All pertinent positive/negative included in HPI, all other review of systems are negative   Objective:  Physical Exam BP 118/78   Pulse 65   Wt 131 lb (59.4 kg)   BMI 24.75 kg/m  CONSTITUTIONAL: Well-developed, well-nourished female in no acute distress.  EYES: EOM intact ENT: Normocephalic CARDIOVASCULAR: Regular rate  RESPIRATORY: Normal rate.  MUSCULOSKELETAL: Normal ROM,  strength equal bilaterally SKIN: Warm, dry without erythema  NEUROLOGICAL: Alert, oriented, CN II-XII grossly intact, Appropriate balance PSYCH: Normal behavior, mood   Assessment & Plan:  Assessment: 1. Side effect of medication   2. Migraine without aura and without status migrainosus, not intractable       Plan: Will utilize 10mg  tabs of imipramine.  Pt can decrease by 10mg  at a time, very slowly in hopes of minimizing the dry mouth, while still sleeping well.  When she finds the optimal dose, if she desires a change in medication strength, she will let us know.   Will use Nurtec for acute HA.  I discussed the improved safety profile over triptans and she agrees to try it.  She will let us know if a change is needed.  Sample/savings card and info provided.  Meds refilled as needed.   Follow-up in 12 months or sooner PRN  Lacie Draft 11/01/2019 8:33 AM

## 2019-11-04 ENCOUNTER — Encounter: Payer: Self-pay | Admitting: *Deleted

## 2019-11-05 DIAGNOSIS — H02722 Madarosis of right lower eyelid and periocular area: Secondary | ICD-10-CM | POA: Diagnosis not present

## 2019-11-05 DIAGNOSIS — H5053 Vertical heterophoria: Secondary | ICD-10-CM | POA: Diagnosis not present

## 2019-11-05 DIAGNOSIS — H43813 Vitreous degeneration, bilateral: Secondary | ICD-10-CM | POA: Diagnosis not present

## 2019-11-05 DIAGNOSIS — H25813 Combined forms of age-related cataract, bilateral: Secondary | ICD-10-CM | POA: Diagnosis not present

## 2019-11-15 ENCOUNTER — Encounter: Payer: Medicare Other | Admitting: Physician Assistant

## 2019-11-25 ENCOUNTER — Telehealth: Payer: Self-pay | Admitting: *Deleted

## 2019-11-25 NOTE — Telephone Encounter (Signed)
Call pt with Karens recommendations, pt will come by office and pick up samples of Ubrevlly to try.

## 2019-11-25 NOTE — Telephone Encounter (Signed)
-----   Message from Paticia Stack, PA-C sent at 11/25/2019  8:33 AM EST ----- Regarding: RE: Nurtec I have reviewed.  I did also provide rx for Maxalt for this patient when she was seen.  She may continue to use it for acute migraine management if she would like to.  We have discussed the cardiovascular risks that come with the triptan class of medications many times before, but this is a proven solution for her migraines.   If she wants to try an additional product, she is welcome to get samples of Ubrelvy from the office.  It has an excellent safety profile with few to no side effects (like the Nurtec).   But again, if she just wants to go back to the Maxalt, which is what she has used for years, she already has the prescription at her pharmacy.   Thank you.  Santiago Glad ----- Message ----- From: Nelta Numbers, RN Sent: 11/25/2019   8:09 AM EST To: Paticia Stack, PA-C Subject: Melton Alar: Nurtec                                      ----- Message ----- From: Allena Earing, NT Sent: 11/22/2019   9:54 AM EST To: Nelta Numbers, RN Subject: Nurtec                                         Patient called and states that she tried Nurtec twice and the medication has no effect on her, in fact her headache got worse. She wanted [place] to inform Santiago Glad about this and see what she suggests. Please contact patient with Karen's response.

## 2019-12-31 ENCOUNTER — Other Ambulatory Visit: Payer: Self-pay | Admitting: *Deleted

## 2019-12-31 ENCOUNTER — Encounter: Payer: Self-pay | Admitting: *Deleted

## 2019-12-31 MED ORDER — UBRELVY 100 MG PO TABS: 100.0000 mg | ORAL_TABLET | ORAL | 11 refills | Status: DC | PRN

## 2020-03-24 DIAGNOSIS — Z1231 Encounter for screening mammogram for malignant neoplasm of breast: Secondary | ICD-10-CM | POA: Diagnosis not present

## 2020-03-31 DIAGNOSIS — R7303 Prediabetes: Secondary | ICD-10-CM | POA: Diagnosis not present

## 2020-03-31 DIAGNOSIS — Z0000 Encounter for general adult medical examination without abnormal findings: Secondary | ICD-10-CM | POA: Diagnosis not present

## 2020-03-31 DIAGNOSIS — I10 Essential (primary) hypertension: Secondary | ICD-10-CM | POA: Diagnosis not present

## 2020-03-31 DIAGNOSIS — E782 Mixed hyperlipidemia: Secondary | ICD-10-CM | POA: Diagnosis not present

## 2020-05-05 DIAGNOSIS — H25813 Combined forms of age-related cataract, bilateral: Secondary | ICD-10-CM | POA: Diagnosis not present

## 2020-05-05 DIAGNOSIS — H5203 Hypermetropia, bilateral: Secondary | ICD-10-CM | POA: Diagnosis not present

## 2020-05-05 DIAGNOSIS — H2513 Age-related nuclear cataract, bilateral: Secondary | ICD-10-CM | POA: Diagnosis not present

## 2020-05-05 DIAGNOSIS — H5053 Vertical heterophoria: Secondary | ICD-10-CM | POA: Diagnosis not present

## 2020-05-11 DIAGNOSIS — Z01419 Encounter for gynecological examination (general) (routine) without abnormal findings: Secondary | ICD-10-CM | POA: Diagnosis not present

## 2020-05-11 DIAGNOSIS — Z8041 Family history of malignant neoplasm of ovary: Secondary | ICD-10-CM | POA: Diagnosis not present

## 2020-05-11 DIAGNOSIS — N8111 Cystocele, midline: Secondary | ICD-10-CM | POA: Diagnosis not present

## 2020-05-11 DIAGNOSIS — Z9189 Other specified personal risk factors, not elsewhere classified: Secondary | ICD-10-CM | POA: Diagnosis not present

## 2020-05-11 DIAGNOSIS — R102 Pelvic and perineal pain: Secondary | ICD-10-CM | POA: Diagnosis not present

## 2020-05-26 DIAGNOSIS — R102 Pelvic and perineal pain: Secondary | ICD-10-CM | POA: Diagnosis not present

## 2020-06-23 DIAGNOSIS — Z85828 Personal history of other malignant neoplasm of skin: Secondary | ICD-10-CM | POA: Diagnosis not present

## 2020-06-23 DIAGNOSIS — L814 Other melanin hyperpigmentation: Secondary | ICD-10-CM | POA: Diagnosis not present

## 2020-06-23 DIAGNOSIS — D225 Melanocytic nevi of trunk: Secondary | ICD-10-CM | POA: Diagnosis not present

## 2020-06-23 DIAGNOSIS — Z85820 Personal history of malignant melanoma of skin: Secondary | ICD-10-CM | POA: Diagnosis not present

## 2020-06-27 DIAGNOSIS — Z23 Encounter for immunization: Secondary | ICD-10-CM | POA: Diagnosis not present

## 2020-10-01 DIAGNOSIS — I10 Essential (primary) hypertension: Secondary | ICD-10-CM | POA: Diagnosis not present

## 2020-10-01 DIAGNOSIS — E782 Mixed hyperlipidemia: Secondary | ICD-10-CM | POA: Diagnosis not present

## 2020-10-01 DIAGNOSIS — R109 Unspecified abdominal pain: Secondary | ICD-10-CM | POA: Diagnosis not present

## 2020-10-01 DIAGNOSIS — R7303 Prediabetes: Secondary | ICD-10-CM | POA: Diagnosis not present

## 2020-11-02 ENCOUNTER — Encounter: Payer: Self-pay | Admitting: *Deleted

## 2020-11-03 DIAGNOSIS — R109 Unspecified abdominal pain: Secondary | ICD-10-CM | POA: Diagnosis not present

## 2020-11-05 DIAGNOSIS — H25813 Combined forms of age-related cataract, bilateral: Secondary | ICD-10-CM | POA: Diagnosis not present

## 2020-11-05 DIAGNOSIS — H5203 Hypermetropia, bilateral: Secondary | ICD-10-CM | POA: Diagnosis not present

## 2020-11-05 DIAGNOSIS — H5053 Vertical heterophoria: Secondary | ICD-10-CM | POA: Diagnosis not present

## 2020-11-05 DIAGNOSIS — H52223 Regular astigmatism, bilateral: Secondary | ICD-10-CM | POA: Diagnosis not present

## 2020-11-13 ENCOUNTER — Ambulatory Visit: Payer: Medicare Other | Admitting: Physician Assistant

## 2020-11-13 ENCOUNTER — Encounter: Payer: Self-pay | Admitting: Physician Assistant

## 2020-11-13 ENCOUNTER — Other Ambulatory Visit: Payer: Self-pay

## 2020-11-13 DIAGNOSIS — G43009 Migraine without aura, not intractable, without status migrainosus: Secondary | ICD-10-CM

## 2020-11-13 MED ORDER — UBRELVY 100 MG PO TABS: 100.0000 mg | ORAL_TABLET | ORAL | 11 refills | Status: DC | PRN

## 2020-11-13 MED ORDER — RIZATRIPTAN BENZOATE 10 MG PO TABS: 10.0000 mg | ORAL_TABLET | ORAL | 11 refills | Status: DC | PRN

## 2020-11-13 NOTE — Progress Notes (Signed)
History:  PRANIKA FINKS is a 80 y.o. who presents to clinic today for yearly headache followup.  She has successfully discontinued imipramine.  The only issue that came about was trouble sleeping.  She is sleeping about 5 hours per night.  She notes she does still feel rested.  She has tried melatonin before but has had diarrhea with it.    HIT6:34 Number of days in the last 4 weeks with:  Severe headache: 0 Moderate headache: 1 Mild headache: 4  No headache: 23   Past Medical History:  Diagnosis Date  . Cancer ([place])    Skin cancer - basal cell removed  . Headache(784.0)   . Hypertension   . Melanoma [facility])     Social History   Socioeconomic History  . Marital status: Married    Spouse name: Not on file  . Number of children: Not on file  . Years of education: Not on file  . Highest education level: Not on file  Occupational History  . Not on file  Tobacco Use  . Smoking status: Never Smoker  . Smokeless tobacco: Never Used  Vaping Use  . Vaping Use: Never used  Substance and Sexual Activity  . Alcohol use: Yes    Comment: Drinks rarely probably  1 glass of wine a year  . Drug use: No  . Sexual activity: Not on file  Other Topics Concern  . Not on file  Social History Narrative  . Not on file   Social Determinants of Health   Financial Resource Strain: Not on file  Food Insecurity: Not on file  Transportation Needs: Not on file  Physical Activity: Not on file  Stress: Not on file  Social Connections: Not on file  Intimate Partner Violence: Not on file    History reviewed. No pertinent family history.  Allergies  Allergen Reactions  . Codeine Other (See Comments)    headaches  . Tape Other (See Comments)    Blisters from tape from  Breast biopsy  . Morphine And Related Rash    Current Outpatient Medications on File Prior to Visit  Medication Sig Dispense Refill  . atenolol-chlorthalidone (TENORETIC) 100-25 MG tablet     . Calcium  Carb-Cholecalciferol (CALCIUM + D3 PO) Take 333 mg by mouth 3 (three) times daily before meals.    . magnesium 30 MG tablet Take 30 mg by mouth 2 (two) times daily.    . Omega-3 Fatty Acids (FISH OIL PO) Take 1,000 mg by mouth once.    . rizatriptan (MAXALT) 10 MG tablet Take 1 tablet (10 mg total) by mouth as needed for migraine. May repeat in 2 hours if needed 12 tablet 11  . TURMERIC PO Take by mouth.    . Ubrogepant (UBRELVY) 100 MG TABS Take 100 mg by mouth as needed (headache). 8 tablet 11  . vitamin B-12 (CYANOCOBALAMIN) 100 MCG tablet Take 100 mcg by mouth daily.    Marland Kitchen aspirin EC 81 MG tablet Take 81 mg by mouth daily. (Patient not taking: Reported on 11/13/2020)    . imipramine (TOFRANIL) 10 MG tablet Take 5 tablets (50 mg total) by mouth at bedtime. (Patient not taking: Reported on 11/13/2020) 450 tablet 3  . Rimegepant Sulfate (NURTEC) 75 MG TBDP Take 75 mg by mouth as needed. (Patient not taking: Reported on 11/13/2020) 8 tablet 11   No current facility-administered medications on file prior to visit.     Review of Systems:  All pertinent positive/negative included in  HPI, all other review of systems are negative   Objective:  Physical Exam BP 133/70   Pulse (!) 56  CONSTITUTIONAL: Well-developed, well-nourished female in no acute distress.  EYES: EOM intact ENT: Normocephalic CARDIOVASCULAR: Regular rate  RESPIRATORY: Normal rate. MUSCULOSKELETAL: Normal ROM SKIN: Warm, dry without erythema  NEUROLOGICAL: Alert, oriented, CN II-XII grossly intact, Appropriate balance   PSYCH: Normal behavior, mood   Assessment & Plan:  Assessment: 1. Migraine without aura and without status migrainosus, not intractable      Plan: Continue current regimen Applauded for the regular exercise and great habits Follow-up in 12 months or sooner PRN  Paticia Stack, PA-C 11/13/2020 9:05 AM

## 2021-01-11 ENCOUNTER — Encounter: Payer: Self-pay | Admitting: *Deleted

## 2021-03-25 DIAGNOSIS — Z1231 Encounter for screening mammogram for malignant neoplasm of breast: Secondary | ICD-10-CM | POA: Diagnosis not present

## 2021-04-21 DIAGNOSIS — Z0000 Encounter for general adult medical examination without abnormal findings: Secondary | ICD-10-CM | POA: Diagnosis not present

## 2021-04-21 DIAGNOSIS — R7303 Prediabetes: Secondary | ICD-10-CM | POA: Diagnosis not present

## 2021-04-21 DIAGNOSIS — I10 Essential (primary) hypertension: Secondary | ICD-10-CM | POA: Diagnosis not present

## 2021-04-21 DIAGNOSIS — E782 Mixed hyperlipidemia: Secondary | ICD-10-CM | POA: Diagnosis not present

## 2021-06-09 DIAGNOSIS — H5203 Hypermetropia, bilateral: Secondary | ICD-10-CM | POA: Diagnosis not present

## 2021-06-21 DIAGNOSIS — M545 Low back pain, unspecified: Secondary | ICD-10-CM | POA: Diagnosis not present

## 2021-06-21 DIAGNOSIS — Z23 Encounter for immunization: Secondary | ICD-10-CM | POA: Diagnosis not present

## 2021-07-28 DIAGNOSIS — L821 Other seborrheic keratosis: Secondary | ICD-10-CM | POA: Diagnosis not present

## 2021-07-28 DIAGNOSIS — Z85828 Personal history of other malignant neoplasm of skin: Secondary | ICD-10-CM | POA: Diagnosis not present

## 2021-07-28 DIAGNOSIS — Z85820 Personal history of malignant melanoma of skin: Secondary | ICD-10-CM | POA: Diagnosis not present

## 2021-07-28 DIAGNOSIS — L814 Other melanin hyperpigmentation: Secondary | ICD-10-CM | POA: Diagnosis not present

## 2021-11-03 DIAGNOSIS — R7303 Prediabetes: Secondary | ICD-10-CM | POA: Diagnosis not present

## 2021-11-03 DIAGNOSIS — I10 Essential (primary) hypertension: Secondary | ICD-10-CM | POA: Diagnosis not present

## 2021-11-03 DIAGNOSIS — Z23 Encounter for immunization: Secondary | ICD-10-CM | POA: Diagnosis not present

## 2021-11-03 DIAGNOSIS — E782 Mixed hyperlipidemia: Secondary | ICD-10-CM | POA: Diagnosis not present

## 2021-11-08 DIAGNOSIS — U071 COVID-19: Secondary | ICD-10-CM | POA: Diagnosis not present

## 2021-11-19 ENCOUNTER — Other Ambulatory Visit: Payer: Self-pay

## 2021-11-19 ENCOUNTER — Encounter: Payer: Self-pay | Admitting: Physician Assistant

## 2021-11-19 ENCOUNTER — Telehealth: Payer: Medicare Other | Admitting: Physician Assistant

## 2021-11-19 DIAGNOSIS — G43009 Migraine without aura, not intractable, without status migrainosus: Secondary | ICD-10-CM | POA: Diagnosis not present

## 2021-11-19 MED ORDER — UBRELVY 100 MG PO TABS: 100.0000 mg | ORAL_TABLET | ORAL | 6 refills | Status: DC | PRN

## 2021-11-19 MED ORDER — RIZATRIPTAN BENZOATE 10 MG PO TABS: 10.0000 mg | ORAL_TABLET | ORAL | 6 refills | Status: DC | PRN

## 2021-11-19 NOTE — Progress Notes (Signed)
I connected with  Katelyn Moore on 11/19/21 at  9:30 AM EST by telephone and verified that I am speaking with the correct person using two identifiers. ?  ?I discussed the limitations, risks, security and privacy concerns of performing an evaluation and management service by telephone and the availability of in person appointments. I also discussed with the patient that there may be a patient responsible charge related to this service. The patient expressed understanding and agreed to proceed. ? ?Crosby Oyster, RN ?11/19/2021  9:41 AM ? ?  ? ? ? ?TELEHEALTH GYNECOLOGY VISIT ENCOUNTER NOTE ? ?Provider location: [facility]  ? ?Patient location: Home ? ?I connected with Katelyn Moore on 11/19/21 at  9:30 AM EST by telephone and verified that I am speaking with the correct person using two identifiers. Patient was unable to do MyChart audiovisual encounter due to technical difficulties, she tried several times.  ?  ?I discussed the limitations, risks, security and privacy concerns of performing an evaluation and management service by telephone and the availability of in person appointments. I also discussed with the patient that there may be a patient responsible charge related to this service. The patient expressed understanding and agreed to proceed. ?  ?History:  ?Katelyn Moore is a 81 y.o. who presents to clinic today for yearly headache follow up.   ?Maxalt is quicker than ubrelvy.  She prefers to take maxalt because of that.   ? ?She began having symptoms of covid 6 days ago.  She has done relatively well with no specialty treatment.   ?She notes she is not having any medical issues and is seeing her PCP every 6 months.  She just saw her pcp last week and her blood pressure was 124/68. ?  ?  ?Past Medical History:  ?Diagnosis Date  ? Cancer [facility])   ? Skin cancer - basal cell removed  ? Headache(784.0)   ? Hypertension   ? Melanoma ([place])   ? ?Past Surgical  History:  ?Procedure Laterality Date  ? ABDOMINAL HYSTERECTOMY    ? BREAST LUMPECTOMY WITH RADIOACTIVE SEED LOCALIZATION Left 11/24/2014  ? Procedure: BREAST LUMPECTOMY WITH RADIOACTIVE SEED LOCALIZATION;  Surgeon: Fanny Skates, MD;  Location: [facility];  Service: General;  Laterality: Left;  ? [place] Fundal plication    ? TONSILLECTOMY    ? TUBAL LIGATION    ? ?The following portions of the patient's history were reviewed and updated as appropriate: allergies, current medications, past family history, past medical history, past social history, past surgical history and problem list.  ? ? ?Review of Systems:  ?Pertinent items noted in HPI and remainder of comprehensive ROS otherwise negative. ? ?Physical Exam:  ? ?General:  Alert, oriented and cooperative.   ?Mental Status: Normal mood and affect perceived. Normal judgment and thought content.  ?Physical exam deferred due to nature of the encounter ? ?Labs and Imaging ?No results found for this or any previous visit (from the past 336 hour(s)). ?No results found.    ?Assessment and Plan:  ?   ?1. Migraine without aura and without status migrainosus, not intractable   ? ?Meds refilled as previous.  Pt states no bp issues, no heart issues.  The triptan is better and she wants to keep using it.  She is advised to confirm with her PCP.   ?RTC 1 year or prn.   ?   ?  ?I discussed the assessment and treatment plan with  the patient. The patient was provided an opportunity to ask questions and all were answered. The patient agreed with the plan and demonstrated an understanding of the instructions. ?  ?The patient was advised to call back or seek an in-person evaluation/go to the ED if the symptoms worsen or if the condition fails to improve as anticipated. ? ?I provided 27 minutes of video face-to-face time during this encounter. ? ? ?Paticia Stack, PA-C ?[facility] [place]  ? ? ?

## 2021-12-07 ENCOUNTER — Other Ambulatory Visit: Payer: Self-pay | Admitting: Physician Assistant

## 2022-03-28 DIAGNOSIS — Z1231 Encounter for screening mammogram for malignant neoplasm of breast: Secondary | ICD-10-CM | POA: Diagnosis not present

## 2022-04-26 ENCOUNTER — Other Ambulatory Visit: Payer: Self-pay

## 2022-04-26 ENCOUNTER — Encounter (HOSPITAL_COMMUNITY): Payer: Self-pay

## 2022-04-26 ENCOUNTER — Observation Stay (HOSPITAL_BASED_OUTPATIENT_CLINIC_OR_DEPARTMENT_OTHER)
Admission: EM | Admit: 2022-04-26 | Discharge: 2022-04-28 | Disposition: A | Discharge status: Home / Self Care | Payer: Medicare Other | Attending: Internal Medicine | Admitting: Internal Medicine

## 2022-04-26 ENCOUNTER — Emergency Department (HOSPITAL_BASED_OUTPATIENT_CLINIC_OR_DEPARTMENT_OTHER): Payer: Medicare Other | Admitting: Radiology

## 2022-04-26 ENCOUNTER — Encounter (HOSPITAL_BASED_OUTPATIENT_CLINIC_OR_DEPARTMENT_OTHER): Payer: Self-pay

## 2022-04-26 DIAGNOSIS — Z79899 Other long term (current) drug therapy: Secondary | ICD-10-CM | POA: Insufficient documentation

## 2022-04-26 DIAGNOSIS — R079 Chest pain, unspecified: Secondary | ICD-10-CM | POA: Diagnosis not present

## 2022-04-26 DIAGNOSIS — R072 Precordial pain: Principal | ICD-10-CM | POA: Insufficient documentation

## 2022-04-26 DIAGNOSIS — I10 Essential (primary) hypertension: Secondary | ICD-10-CM | POA: Insufficient documentation

## 2022-04-26 DIAGNOSIS — Z7982 Long term (current) use of aspirin: Secondary | ICD-10-CM | POA: Diagnosis not present

## 2022-04-26 DIAGNOSIS — E782 Mixed hyperlipidemia: Secondary | ICD-10-CM | POA: Insufficient documentation

## 2022-04-26 DIAGNOSIS — K219 Gastro-esophageal reflux disease without esophagitis: Secondary | ICD-10-CM | POA: Diagnosis present

## 2022-04-26 DIAGNOSIS — G43009 Migraine without aura, not intractable, without status migrainosus: Secondary | ICD-10-CM | POA: Diagnosis present

## 2022-04-26 DIAGNOSIS — Z85828 Personal history of other malignant neoplasm of skin: Secondary | ICD-10-CM | POA: Diagnosis not present

## 2022-04-26 DIAGNOSIS — R0789 Other chest pain: Secondary | ICD-10-CM | POA: Diagnosis not present

## 2022-04-26 DIAGNOSIS — R7303 Prediabetes: Secondary | ICD-10-CM | POA: Insufficient documentation

## 2022-04-26 LAB — CBC
HCT: 39.9 % (ref 36.0–46.0)
HCT: 39 % (ref 36.0–46.0)
Hemoglobin: 13.7 g/dL (ref 12.0–15.0)
Hemoglobin: 13.4 g/dL (ref 12.0–15.0)
MCH: 31.2 pg (ref 26.0–34.0)
MCH: 31.4 pg (ref 26.0–34.0)
MCHC: 34.3 g/dL (ref 30.0–36.0)
MCHC: 34.4 g/dL (ref 30.0–36.0)
MCV: 90.9 fL (ref 80.0–100.0)
MCV: 91.3 fL (ref 80.0–100.0)
Platelets: 233 10*3/uL (ref 150–400)
Platelets: 239 10*3/uL (ref 150–400)
RBC: 4.39 MIL/uL (ref 3.87–5.11)
RBC: 4.27 MIL/uL (ref 3.87–5.11)
RDW: 12.1 % (ref 11.5–15.5)
RDW: 12 % (ref 11.5–15.5)
WBC: 9.2 10*3/uL (ref 4.0–10.5)
WBC: 12.1 10*3/uL — ABNORMAL HIGH (ref 4.0–10.5)
nRBC: 0 % (ref 0.0–0.2)
nRBC: 0 % (ref 0.0–0.2)

## 2022-04-26 LAB — TROPONIN I (HIGH SENSITIVITY)
Troponin I (High Sensitivity): 5 ng/L (ref <18)
Troponin I (High Sensitivity): 4 ng/L (ref <18)

## 2022-04-26 LAB — BASIC METABOLIC PANEL
Anion gap: 12 (ref 5–15)
BUN: 18 mg/dL (ref 8–23)
CO2: 29 mmol/L (ref 22–32)
Calcium: 10.1 mg/dL (ref 8.9–10.3)
Chloride: 93 mmol/L — ABNORMAL LOW (ref 98–111)
Creatinine, Ser: 0.77 mg/dL (ref 0.44–1.00)
GFR, Estimated: >60 mL/min (ref >60)
Glucose, Bld: 118 mg/dL — ABNORMAL HIGH (ref 70–99)
Potassium: 3.8 mmol/L (ref 3.5–5.1)
Sodium: 134 mmol/L — ABNORMAL LOW (ref 135–145)

## 2022-04-26 LAB — HEPATIC FUNCTION PANEL
ALT: 25 U/L (ref 0–44)
AST: 25 U/L (ref 15–41)
Albumin: 4.4 g/dL (ref 3.5–5.0)
Alkaline Phosphatase: 67 U/L (ref 38–126)
Bilirubin, Direct: 0.2 mg/dL (ref 0.0–0.2)
Indirect Bilirubin: 0.6 mg/dL (ref 0.3–0.9)
Total Bilirubin: 0.8 mg/dL (ref 0.3–1.2)
Total Protein: 6.9 g/dL (ref 6.5–8.1)

## 2022-04-26 LAB — CREATININE, SERUM
Creatinine, Ser: 1.2 mg/dL — ABNORMAL HIGH (ref 0.44–1.00)
GFR, Estimated: 46 mL/min — ABNORMAL LOW (ref >60)

## 2022-04-26 LAB — LIPASE, BLOOD: Lipase: 23 U/L (ref 11–51)

## 2022-04-26 MED ORDER — ONDANSETRON HCL 4 MG/2ML IJ SOLN: 4.0000 mg | Freq: Four times a day (QID) | INTRAMUSCULAR | Status: DC | PRN

## 2022-04-26 MED ORDER — ENOXAPARIN SODIUM 40 MG/0.4ML IJ SOSY
40.0000 mg | PREFILLED_SYRINGE | INTRAMUSCULAR | Status: DC
  Administered 2022-04-26 – 2022-04-27 (×2): 40 mg via SUBCUTANEOUS
  Filled 2022-04-26 (×2): qty 0.4

## 2022-04-26 MED ORDER — ACETAMINOPHEN 650 MG RE SUPP: 650.0000 mg | Freq: Four times a day (QID) | RECTAL | Status: DC | PRN

## 2022-04-26 MED ORDER — ASPIRIN 81 MG PO TBEC
81.0000 mg | DELAYED_RELEASE_TABLET | Freq: Every day | ORAL | Status: DC
  Administered 2022-04-26 – 2022-04-28 (×3): 81 mg via ORAL
  Filled 2022-04-26 (×3): qty 1

## 2022-04-26 MED ORDER — HYDRALAZINE HCL 20 MG/ML IJ SOLN: 5.0000 mg | Freq: Three times a day (TID) | INTRAMUSCULAR | Status: DC | PRN

## 2022-04-26 MED ORDER — ACETAMINOPHEN 325 MG PO TABS
650.0000 mg | ORAL_TABLET | Freq: Four times a day (QID) | ORAL | Status: DC | PRN
  Administered 2022-04-26: 650 mg via ORAL
  Filled 2022-04-26: qty 2

## 2022-04-26 MED ORDER — LIDOCAINE VISCOUS HCL 2 % MT SOLN
15.0000 mL | Freq: Once | OROMUCOSAL | Status: AC
  Administered 2022-04-26: 15 mL via ORAL
  Filled 2022-04-26: qty 15

## 2022-04-26 MED ORDER — NITROGLYCERIN 0.4 MG SL SUBL
0.4000 mg | SUBLINGUAL_TABLET | SUBLINGUAL | Status: DC | PRN
  Administered 2022-04-26: 0.4 mg via SUBLINGUAL
  Filled 2022-04-26: qty 1

## 2022-04-26 MED ORDER — ONDANSETRON HCL 4 MG PO TABS: 4.0000 mg | ORAL_TABLET | Freq: Four times a day (QID) | ORAL | Status: DC | PRN

## 2022-04-26 MED ORDER — POLYETHYLENE GLYCOL 3350 17 G PO PACK: 17.0000 g | PACK | Freq: Every day | ORAL | Status: DC | PRN

## 2022-04-26 MED ORDER — ALUM & MAG HYDROXIDE-SIMETH 200-200-20 MG/5ML PO SUSP
30.0000 mL | Freq: Once | ORAL | Status: AC
  Administered 2022-04-26: 30 mL via ORAL
  Filled 2022-04-26: qty 30

## 2022-04-26 MED ORDER — PANTOPRAZOLE SODIUM 40 MG PO TBEC
40.0000 mg | DELAYED_RELEASE_TABLET | Freq: Every day | ORAL | Status: DC
  Administered 2022-04-26: 40 mg via ORAL
  Filled 2022-04-26: qty 1

## 2022-04-26 NOTE — H&P (Signed)
History and Physical    Patient: Katelyn Moore QVZ:[id number] DOB: [birth year] DOA: 04/26/2022 DOS: the patient was seen and examined on 04/26/2022 PCP: Mckinley Jewel, MD  Patient coming from: Home  Chief Complaint:  Chief Complaint  Patient presents with   Chest Pain   HPI: EVYNN Moore is a 81 y.o. female with medical history significant of hypertension, migraine and melanoma was presented to [facility] ED with concern of lower substernal chest pain.  Per patient she developed some right-sided jaw pain 2 days ago for which she was taking ibuprofen, she took 2 doses of 400 mg 1 at night and 1 in the morning, she developed lower substernal chest pain early afternoon yesterday, nonradiating, feels more like crampy pain with some pressure component, no exertional component.  Patient went for workout which did not cause any worsening of pain.  Little worsening of discomfort with eating. There was some associated nausea but no vomiting.  No shortness of breath or exertional dyspnea.  Patient does not have any prior cardiac history.  Patient denies any recent illnesses or upper respiratory symptoms.  No fever or chills.  Little decrease in appetite for 1 day.  No recent change in bowel habits.  No urinary symptoms.  ED course.  Hemodynamically stable.  Labs pretty much unremarkable.  Troponin remain negative.  EKG reviewed and shows normal sinus rhythm and no specific abnormality. Chest x-ray without any active cardiopulmonary disease.  Case was discussed with cardiology by ED provider who recommend admission under observation and they might do a stress test tomorrow morning.  Review of Systems: As mentioned in the history of present illness. All other systems reviewed and are negative. Past Medical History:  Diagnosis Date   Cancer ([place])    Skin cancer - basal cell removed   Headache(784.0)    Hypertension    Melanoma ([place])    Past Surgical History:  Procedure Laterality  Date   ABDOMINAL HYSTERECTOMY     BREAST LUMPECTOMY WITH RADIOACTIVE SEED LOCALIZATION Left 11/24/2014   Procedure: BREAST LUMPECTOMY WITH RADIOACTIVE SEED LOCALIZATION;  Surgeon: Fanny Skates, MD;  Location: [place];  Service: General;  Laterality: Left;   Nissan Fundal plication     TONSILLECTOMY     TUBAL LIGATION     Social History:  reports that she has never smoked. She has never used smokeless tobacco. She reports current alcohol use. She reports that she does not use drugs.  Allergies  Allergen Reactions   Codeine Other (See Comments)    headaches   Tape Other (See Comments)    Blisters from tape from  Breast biopsy   Morphine And Related Rash    History reviewed. No pertinent family history.  Prior to Admission medications   Medication Sig Start Date End Date Taking? Authorizing Provider  atenolol-chlorthalidone (TENORETIC) 100-25 MG tablet  10/15/16  Yes [provider]  Calcium Carb-Cholecalciferol (CALCIUM + D3 PO) Take 333 mg by mouth 3 (three) times daily before meals.   Yes [provider]  magnesium 30 MG tablet Take 30 mg by mouth 2 (two) times daily.   Yes [provider]  Omega-3 Fatty Acids (FISH OIL PO) Take 1,000 mg by mouth once.   Yes [provider]  rizatriptan (MAXALT) 10 MG tablet Take 1 tablet (10 mg total) by mouth as needed for migraine. May repeat in 2 hours if needed 11/19/21  Yes [place] Bobbye Morton, PA-C  TURMERIC PO Take by mouth.  Yes [provider]  UBRELVY 100 MG TABS TAKE 1 TABLET BY MOUTH AS NEEDED FOR HEADACHE 12/07/21  Yes Teague Bobbye Morton, PA-C  vitamin B-12 (CYANOCOBALAMIN) 100 MCG tablet Take 100 mcg by mouth daily.   Yes [provider]  aspirin EC 81 MG tablet Take 81 mg by mouth daily.    [provider]    Physical Exam: Vitals:   04/26/22 1130 04/26/22 1247 04/26/22 1430 04/26/22 1548  BP: (!) 117/54  (!) 138/57 (!) 143/66  Pulse: (!) 45  (!)  56 (!) 52  Resp: '16  19 16  '$ Temp:  98.7 F (37.1 C)  97.6 F (36.4 C)  TempSrc:  Oral  Oral  SpO2: 93%  97% 98%  Weight:    57.2 kg  Height:    5' (1.524 m)    General: Vital signs reviewed.  Patient is well-developed and well-nourished, in no acute distress and cooperative with exam.  Head: Normocephalic and atraumatic. Eyes: EOMI, conjunctivae normal, no scleral icterus.  Neck: Supple, trachea midline, normal ROM, no JVD,  Cardiovascular: RRR, S1 normal, S2 normal, no murmurs, gallops, or rubs. Pulmonary/Chest: Clear to auscultation bilaterally, no wheezes, rales, or rhonchi. Abdominal: Soft, non-tender, non-distended, BS +,  Extremities: No lower extremity edema bilaterally,  pulses symmetric and intact bilaterally. No cyanosis or clubbing. Neurological: A&O x3, Strength is normal and symmetric bilaterally, cranial nerve II-XII are grossly intact, no focal motor deficit, sensory intact to light touch bilaterally.  Skin: Warm, dry and intact. No rashes or erythema. Psychiatric: Normal mood and affect.   Data Reviewed: Prior data reviewed.  Assessment and Plan: * Chest pain Patient has nonspecific chest pain, more like crampy pain.EKG without any significant abnormality and troponin remain negative.  No exertional component.  Might be related to GI as pain started after taking ibuprofen twice for some jaw pain. Cardiology was consulted from [facility] and they might do stress testing tomorrow. -Admit under observation to telemetry -Lipid panel and A1c to complete the work-up -Echocardiogram -N.p.o. after midnight-a.m. provider to discuss with cardiology whether they would like to do stress test or not. 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  Current symptoms appear more like GERD as little worsening of pain with food. -GI cocktail -Start her on Protonix  Essential hypertension Blood pressure within  goal and heart rate in high 50s. Patient takes atenolol and chlorthalidone at home. -Holding home meds.  Migraine without aura, not intractable - Can resume home Maxalt if needed    Advance Care Planning:   Code Status: Full Code confirmed with patient  Consults: Cardiology from ED  Family Communication:   Severity of Illness: The appropriate patient status for this patient is OBSERVATION. Observation status is judged to be reasonable and necessary in order to provide the required intensity of service to ensure the patient's safety. The patient's presenting symptoms, physical exam findings, and initial radiographic and laboratory data in the context of their medical condition is felt to place them at decreased risk for further clinical deterioration. Furthermore, it is anticipated that the patient will be medically stable for discharge from the hospital within 2 midnights of admission.   This record has been created using Systems analyst. Errors have been sought and corrected,but may not always be located. Such creation errors do not reflect on the standard of care.   Author: Lorella Nimrod, MD 04/26/2022 6:22 PM  For on  call review [email or web address].

## 2022-04-26 NOTE — Assessment & Plan Note (Signed)
Patient has a remote history of GERD.  Current symptoms appear more like GERD as little worsening of pain with food. -GI cocktail -Start her on Protonix

## 2022-04-26 NOTE — ED Triage Notes (Signed)
Pt c/o chest pain that started yesterday. States it comes and go. Pt took miralox for the pain but did not help.

## 2022-04-26 NOTE — Progress Notes (Signed)
Patient arrived from [facility] ED to [facility] 4E-[id number].  Skin assessment unremarkable except for a small, red rash on her back.  She states her doctor told her the redness is from arthritis.  VSS, CCMD made aware.  Dr. Reesa Chew advised not to give beta blocker d/t heart rate ranging from 40s-50s.

## 2022-04-26 NOTE — ED Notes (Signed)
ED TO INPATIENT HANDOFF REPORT  ED Nurse Name and Phone #: Baxter Flattery, RN  S Name/Age/Gender Katelyn Moore 81 y.o. female Room/Bed: DB015/DB015  Code Status   Code Status: Not on file  Home/SNF/Other Home Patient oriented to: self, place, time, and situation Is this baseline? Yes   Triage Complete: Triage complete  Chief Complaint Chest pain [R07.9]  Triage Note Pt c/o chest pain that started yesterday. States it comes and go. Pt took miralox for the pain but did not help.   Allergies Allergies  Allergen Reactions   Codeine Other (See Comments)    headaches   Tape Other (See Comments)    Blisters from tape from  Breast biopsy   Morphine And Related Rash    Level of Care/Admitting Diagnosis ED Disposition     ED Disposition  Admit   Condition  --   [place]: [place] [[id number]]  Level of Care: Telemetry Cardiac [103]  Interfacility transfer: Yes  May place patient in observation at [facility] or [place] if equivalent level of care is available:: No  Covid Evaluation: Asymptomatic - no recent exposure (last 10 days) testing not required  Diagnosis: Chest pain [998338]  Admitting Physician: Orma Flaming [[id number]]  Attending Physician: Adora Fridge          B Medical/Surgery History Past Medical History:  Diagnosis Date   Cancer ([place])    Skin cancer - basal cell removed   Headache(784.0)    Hypertension    Melanoma ([place])    Past Surgical History:  Procedure Laterality Date   ABDOMINAL HYSTERECTOMY     BREAST LUMPECTOMY WITH RADIOACTIVE SEED LOCALIZATION Left 11/24/2014   Procedure: BREAST LUMPECTOMY WITH RADIOACTIVE SEED LOCALIZATION;  Surgeon: Fanny Skates, MD;  Location: [place];  Service: General;  Laterality: Left;   Nissan Fundal plication     TONSILLECTOMY     TUBAL LIGATION       A IV Location/Drains/Wounds Patient Lines/Drains/Airways Status     Active  Line/Drains/Airways     Name Placement date Placement time Site Days   Peripheral IV 04/26/22 20 G 1" Left Antecubital 04/26/22  0755  Antecubital  less than 1   Incision (Closed) 11/24/14 Breast Left 11/24/14  1108  -- 2710            Intake/Output Last 24 hours No intake or output data in the 24 hours ending 04/26/22 1412  Labs/Imaging Results for orders placed or performed during the hospital encounter of 04/26/22 (from the past 48 hour(s))  Basic metabolic panel     Status: Abnormal   Collection Time: 04/26/22  7:54 AM  Result Value Ref Range   Sodium 134 (L) 135 - 145 mmol/L   Potassium 3.8 3.5 - 5.1 mmol/L   Chloride 93 (L) 98 - 111 mmol/L   CO2 29 22 - 32 mmol/L   Glucose, Bld 118 (H) 70 - 99 mg/dL    Comment: Glucose reference range applies only to samples taken after fasting for at least 8 hours.   BUN 18 8 - 23 mg/dL   Creatinine, Ser 0.77 0.44 - 1.00 mg/dL   Calcium 10.1 8.9 - 10.3 mg/dL   GFR, Estimated >60 >60 mL/min    Comment: (NOTE) Calculated using the CKD-EPI Creatinine Equation (2021)    Anion gap 12 5 - 15    Comment: Performed at [facility], [address] [place], [place] [address]  CBC  Status: None   Collection Time: 04/26/22  7:54 AM  Result Value Ref Range   WBC 9.2 4.0 - 10.5 K/uL   RBC 4.39 3.87 - 5.11 MIL/uL   Hemoglobin 13.7 12.0 - 15.0 g/dL   HCT 39.9 36.0 - 46.0 %   MCV 90.9 80.0 - 100.0 fL   MCH 31.2 26.0 - 34.0 pg   MCHC 34.3 30.0 - 36.0 g/dL   RDW 12.1 11.5 - 15.5 %   Platelets 233 150 - 400 K/uL   nRBC 0.0 0.0 - 0.2 %    Comment: Performed at [facility], [address] [place], [place] [address]  Troponin I (High Sensitivity)     Status: None   Collection Time: 04/26/22  7:54 AM  Result Value Ref Range   Troponin I (High Sensitivity) 5 <18 ng/L    Comment: (NOTE) Elevated high sensitivity troponin I (hsTnI) values and significant  changes across serial measurements may suggest  ACS but many other  chronic and acute conditions are known to elevate hsTnI results.  Refer to the "Links" section for chest pain algorithms and additional  guidance. Performed at [facility], [address] [place], [place] [address]   Hepatic function panel     Status: None   Collection Time: 04/26/22  7:54 AM  Result Value Ref Range   Total Protein 6.9 6.5 - 8.1 g/dL   Albumin 4.4 3.5 - 5.0 g/dL   AST 25 15 - 41 U/L   ALT 25 0 - 44 U/L   Alkaline Phosphatase 67 38 - 126 U/L   Total Bilirubin 0.8 0.3 - 1.2 mg/dL   Bilirubin, Direct 0.2 0.0 - 0.2 mg/dL   Indirect Bilirubin 0.6 0.3 - 0.9 mg/dL    Comment: Performed at [facility], [place], [place] [address]  Lipase, blood     Status: None   Collection Time: 04/26/22  7:54 AM  Result Value Ref Range   Lipase 23 11 - 51 U/L    Comment: Performed at [facility], [place], [place] [address]  Troponin I (High Sensitivity)     Status: None   Collection Time: 04/26/22 10:07 AM  Result Value Ref Range   Troponin I (High Sensitivity) 4 <18 ng/L    Comment: (NOTE) Elevated high sensitivity troponin I (hsTnI) values and significant  changes across serial measurements may suggest ACS but many other  chronic and acute conditions are known to elevate hsTnI results.  Refer to the "Links" section for chest pain algorithms and additional  guidance. Performed at [facility], [address] [place], [place] [address]    DG Chest 2 View  Result Date: 04/26/2022 CLINICAL DATA:  Chest pain. EXAM: CHEST - 2 VIEW COMPARISON:  October 02, 2009. FINDINGS: The heart size and mediastinal contours are within normal limits. Both lungs are clear. The visualized skeletal structures are unremarkable. IMPRESSION: No active cardiopulmonary disease. Electronically Signed   By: Marijo Conception M.D.   On: 04/26/2022 08:26    Pending  Labs Unresulted Labs (From admission, onward)    None       Vitals/Pain Today's Vitals   04/26/22 1000 04/26/22 1007 04/26/22 1130 04/26/22 1247  BP: (!) 121/57  (!) 117/54   Pulse: (!) 46  (!) 45   Resp: 17  16   Temp:    98.7 F (37.1 C)  TempSrc:    Oral  SpO2: 95%  93%  Weight:      Height:      PainSc:  (S) 3       Isolation Precautions No active isolations  Medications Medications  nitroGLYCERIN (NITROSTAT) SL tablet 0.4 mg (0.4 mg Sublingual Given 04/26/22 0918)  alum & mag hydroxide-simeth (MAALOX/MYLANTA) 200-200-20 MG/5ML suspension 30 mL (30 mLs Oral Given 04/26/22 0836)    And  lidocaine (XYLOCAINE) 2 % viscous mouth solution 15 mL (15 mLs Oral Given 04/26/22 0836)    Mobility walks Low fall risk   Focused Assessments Cardiac Assessment Handoff:  Cardiac Rhythm: Normal sinus rhythm No results found for: "CKTOTAL", "CKMB", "CKMBINDEX", "TROPONINI" No results found for: "DDIMER" Does the Patient currently have chest pain? No    R Recommendations: See Admitting Provider Note  Report given to:   Additional Notes:

## 2022-04-26 NOTE — Assessment & Plan Note (Signed)
-   Can resume home Maxalt if needed

## 2022-04-26 NOTE — Assessment & Plan Note (Signed)
Blood pressure within goal and heart rate in high 50s. Patient takes atenolol and chlorthalidone at home. -Holding home meds.

## 2022-04-26 NOTE — Assessment & Plan Note (Addendum)
Patient has nonspecific chest pain, more like crampy pain.EKG without any significant abnormality and troponin remain negative.  No exertional component.  Might be related to GI as pain started after taking ibuprofen twice for some jaw pain. Cardiology was consulted from [facility] ED and they might do stress testing tomorrow. -Admit under observation to telemetry -Lipid panel and A1c to complete the work-up -Echocardiogram -N.p.o. after midnight-a.m. provider to discuss with cardiology whether they would like to do stress test or not. -Aspirin 81 mg daily

## 2022-04-26 NOTE — Progress Notes (Signed)
Plan of Care Note for accepted transfer   Patient: Katelyn Moore MRN: [id number]   DOA: 04/26/2022  Facility requesting transfer: [facility]  Requesting Provider: Dr. Langston Masker Reason for transfer: chest pain Facility course: 81 y.o female with history of HTN, migraine, GERD, HLD, prediabetes, melanoma who presented to ED with c/o intermittent chest pain. Started to have chest pressure pressure last night associated with nausea. SL nitro helped in ED for about 30 minutes. Spoke with Dr. Stanford Breed who recommended obs and could stress her in the hospital. They will consult on her in the hospital.   Vitals stable.  Pertinent labs: none. Troponin wnl x 2 Ekg: sinus, rate of 55. No acute ST changes CXR with no acute finding   Plan of care: The patient is accepted for admission to Telemetry unit, at [facility]    Author: Orma Flaming, MD 04/26/2022  Check [email or web address] for on-call coverage.  Nursing staff, Please call [place] number on Amion as soon as patient's arrival, so appropriate admitting provider can evaluate the pt.

## 2022-04-26 NOTE — ED Provider Notes (Signed)
[place] [facility] EMERGENCY DEPT Provider Note   CSN: [id number] Arrival date & time: 04/26/22  0741     History  Chief Complaint  Patient presents with   Chest Pain    Katelyn Moore is a 81 y.o. female with a history of hypertension presenting to ED complaining of chest pain.  She reports that she developed discomfort in her epigastrium yesterday evening while volunteering at church, and felt like a squeezing sensation in her epigastrium that at times went to her back.  She felt nauseated with this.  She went home last night, had dinner, tried some Tums which did not relieve her symptoms, and reports that the pain kept her up all night.  It is intermittent, lasts a few seconds at a time and then go away.  She has never had this sensation before.  She does exercise at least 3 times a week with her husband and reports she does not typically have chest pain.  She has never had a cardiac workup.  She reports a history of hypertension, denies high cholesterol, diabetes, smoking, or significant family history of MI.  She currently is experiencing very mild, intermittent pains, lasting a few seconds and then going away.  She says the pain can sometimes feel a "gas pain".  She did try to take some MiraLAX earlier today, although she is having regular bowel movements and denies constipation.  She reports a history of an appendectomy but denies any other abdominal surgeries.  HPI     Home Medications Prior to Admission medications   Medication Sig Start Date End Date Taking? Authorizing Provider  atenolol-chlorthalidone (TENORETIC) 100-25 MG tablet  10/15/16  Yes [provider]  Calcium Carb-Cholecalciferol (CALCIUM + D3 PO) Take 333 mg by mouth 3 (three) times daily before meals.   Yes [provider]  magnesium 30 MG tablet Take 30 mg by mouth 2 (two) times daily.   Yes [provider]  Omega-3 Fatty Acids (FISH OIL PO) Take 1,000 mg by mouth once.    Yes [provider]  rizatriptan (MAXALT) 10 MG tablet Take 1 tablet (10 mg total) by mouth as needed for migraine. May repeat in 2 hours if needed 11/19/21  Yes Teague Bobbye Morton, PA-C  TURMERIC PO Take by mouth.   Yes [provider]  UBRELVY 100 MG TABS TAKE 1 TABLET BY MOUTH AS NEEDED FOR HEADACHE 12/07/21  Yes Teague Bobbye Morton, PA-C  vitamin B-12 (CYANOCOBALAMIN) 100 MCG tablet Take 100 mcg by mouth daily.   Yes [provider]  aspirin EC 81 MG tablet Take 81 mg by mouth daily.    [provider]      Allergies    Codeine, Tape, and Morphine and related    Review of Systems   Review of Systems  Physical Exam Updated Vital Signs BP (!) 117/54   Pulse (!) 45   Temp 98.7 F (37.1 C) (Oral)   Resp 16   Ht '5\' 1"'$  (1.549 m)   Wt 59.4 kg   SpO2 93%   BMI 24.75 kg/m  Physical Exam Constitutional:      General: She is not in acute distress. HENT:     Head: Normocephalic and atraumatic.  Eyes:     Conjunctiva/sclera: Conjunctivae normal.     Pupils: Pupils are equal, round, and reactive to light.  Cardiovascular:     Rate and Rhythm: Normal rate and regular rhythm.  Pulmonary:     Effort: Pulmonary  effort is normal. No respiratory distress.  Abdominal:     General: There is no distension.     Tenderness: There is no abdominal tenderness.  Skin:    General: Skin is warm and dry.  Neurological:     General: No focal deficit present.     Mental Status: She is alert. Mental status is at baseline.  Psychiatric:        Mood and Affect: Mood normal.        Behavior: Behavior normal.     ED Results / Procedures / Treatments   Labs (all labs ordered are listed, but only abnormal results are displayed) Labs Reviewed  BASIC METABOLIC PANEL - Abnormal; Notable for the following components:      Result Value   Sodium 134 (*)    Chloride 93 (*)    Glucose, Bld 118 (*)    All other components within normal limits  CBC  HEPATIC  FUNCTION PANEL  LIPASE, BLOOD  TROPONIN I (HIGH SENSITIVITY)  TROPONIN I (HIGH SENSITIVITY)    EKG EKG Interpretation  Date/Time:  Tuesday April 26 2022 07:51:40 EDT Ventricular Rate:  55 PR Interval:  165 QRS Duration: 108 QT Interval:  442 QTC Calculation: 423 R Axis:   20 Text Interpretation: Sinus rhythm Borderline T abnormalities, diffuse leads No sig change from prior tracing March 2016 Confirmed by Octaviano Glow [phone]) on 04/26/2022 8:22:04 AM  Radiology DG Chest 2 View  Result Date: 04/26/2022 CLINICAL DATA:  Chest pain. EXAM: CHEST - 2 VIEW COMPARISON:  October 02, 2009. FINDINGS: The heart size and mediastinal contours are within normal limits. Both lungs are clear. The visualized skeletal structures are unremarkable. IMPRESSION: No active cardiopulmonary disease. Electronically Signed   By: Marijo Conception M.D.   On: 04/26/2022 08:26    Procedures Procedures    Medications Ordered in ED Medications  nitroGLYCERIN (NITROSTAT) SL tablet 0.4 mg (0.4 mg Sublingual Given 04/26/22 0918)  alum & mag hydroxide-simeth (MAALOX/MYLANTA) 200-200-20 MG/5ML suspension 30 mL (30 mLs Oral Given 04/26/22 0836)    And  lidocaine (XYLOCAINE) 2 % viscous mouth solution 15 mL (15 mLs Oral Given 04/26/22 0836)    ED Course/ Medical Decision Making/ A&P Clinical Course as of 04/26/22 1426  Tue Apr 26, 2022  0915 Patient reassessed, symptoms of not improved after GI cocktail.  He continues to have intermittent discomfort in her chest.  We will try 7 1 nitroglycerin.  Initial troponin was 5, awaiting on repeat level and liver function test [MT]  1138 Patient felt that her pain temporarily improved after the nitroglycerin but has not returned with several episodes in the past 10 minutes.  She is hungry and asking for food.  At this point given that she is having persistent pain, discussed the case with cardiology, regarding possible admission for further cardiac workup.  As long as they do not  plan for any emergent testing today, the patient could have lunch. [MT]  1150 Dr Stanford Breed cardiology advises medical admission, they will consult. [MT]  1244 Admitted to Dr Elinor Parkinson hospitalist [MT]    Clinical Course User Index [MT] Langston Masker Carola Rhine, MD                           Medical Decision Making Amount and/or Complexity of Data Reviewed Labs: ordered. Radiology: ordered.  Risk OTC drugs. Prescription drug management. Decision regarding hospitalization.   This patient presents to the ED with concern  for lower chest and epigastric pain. This involves an extensive number of treatment options, and is a complaint that carries with it a high risk of complications and morbidity.  The differential diagnosis includes atypical ACS versus gastritis versus reflux versus biliary disease versus esophageal spasms versus pneumonia versus other  Because her symptoms are so localized to epigastrium and so fleeting, I elected to try GI cocktail first.   Co-morbidities that complicate the patient evaluation: pt's age and HTN are risk factors for CV disease  Additional history obtained from pt's husband at bedside  I ordered and personally interpreted labs.  The pertinent results include: Labs unremarkable  I ordered imaging studies including dg chest I independently visualized and interpreted imaging which showed no active disease processes I agree with the radiologist interpretation  The patient was maintained on a cardiac monitor.  I personally viewed and interpreted the cardiac monitored which showed an underlying rhythm of: sinus rhythm/mild bradycardia  Per my interpretation the patient's ECG shows mild sinus bradycardia with no acute ischemic findings  I ordered medication including Gi cocktail for possible gastritis I have reviewed the patients home medicines and have made adjustments as needed  Test Considered: Lower suspicion today for acute PE or acute biliary disease at this  presentation, negative Murphy sign, no liver enzyme abnormalities for  I requested consultation with the cardiology,  and discussed lab and imaging findings as well as pertinent plan - they recommend: Medical admission and cardiology team consult  After the interventions noted above, I reevaluated the patient and found that they have: stayed the same   Dispostion:  After consideration of the diagnostic results and the patients response to treatment, I feel that the patent would benefit from medical admission         Final Clinical Impression(s) / ED Diagnoses Final diagnoses:  Chest pain, unspecified type    Rx / DC Orders ED Discharge Orders     None         Wyvonnia Dusky, MD 04/26/22 1427

## 2022-04-27 ENCOUNTER — Observation Stay (HOSPITAL_BASED_OUTPATIENT_CLINIC_OR_DEPARTMENT_OTHER): Payer: Medicare Other

## 2022-04-27 DIAGNOSIS — I10 Essential (primary) hypertension: Secondary | ICD-10-CM | POA: Diagnosis not present

## 2022-04-27 DIAGNOSIS — R079 Chest pain, unspecified: Secondary | ICD-10-CM | POA: Diagnosis not present

## 2022-04-27 DIAGNOSIS — R9431 Abnormal electrocardiogram [ECG] [EKG]: Secondary | ICD-10-CM

## 2022-04-27 DIAGNOSIS — K219 Gastro-esophageal reflux disease without esophagitis: Secondary | ICD-10-CM | POA: Diagnosis not present

## 2022-04-27 LAB — HEMOGLOBIN A1C
Hgb A1c MFr Bld: 5.3 % (ref 4.8–5.6)
Mean Plasma Glucose: 105.41 mg/dL

## 2022-04-27 LAB — BASIC METABOLIC PANEL
Anion gap: 8 (ref 5–15)
BUN: 17 mg/dL (ref 8–23)
CO2: 28 mmol/L (ref 22–32)
Calcium: 8.6 mg/dL — ABNORMAL LOW (ref 8.9–10.3)
Chloride: 95 mmol/L — ABNORMAL LOW (ref 98–111)
Creatinine, Ser: 0.87 mg/dL (ref 0.44–1.00)
GFR, Estimated: >60 mL/min (ref >60)
Glucose, Bld: 112 mg/dL — ABNORMAL HIGH (ref 70–99)
Potassium: 3.3 mmol/L — ABNORMAL LOW (ref 3.5–5.1)
Sodium: 131 mmol/L — ABNORMAL LOW (ref 135–145)

## 2022-04-27 LAB — LIPID PANEL
Cholesterol: 157 mg/dL (ref 0–200)
HDL: 51 mg/dL (ref >40)
LDL Cholesterol: 89 mg/dL (ref 0–99)
Total CHOL/HDL Ratio: 3.1 RATIO
Triglycerides: 85 mg/dL (ref <150)
VLDL: 17 mg/dL (ref 0–40)

## 2022-04-27 LAB — CBC
HCT: 36.4 % (ref 36.0–46.0)
Hemoglobin: 12.9 g/dL (ref 12.0–15.0)
MCH: 32.2 pg (ref 26.0–34.0)
MCHC: 35.4 g/dL (ref 30.0–36.0)
MCV: 90.8 fL (ref 80.0–100.0)
Platelets: 220 10*3/uL (ref 150–400)
RBC: 4.01 MIL/uL (ref 3.87–5.11)
RDW: 11.9 % (ref 11.5–15.5)
WBC: 8.9 10*3/uL (ref 4.0–10.5)
nRBC: 0 % (ref 0.0–0.2)

## 2022-04-27 LAB — ECHOCARDIOGRAM COMPLETE
Area-P 1/2: 3.39 cm2
Height: 60 in
P 1/2 time: 584 msec
S' Lateral: 3.4 cm
Weight: 2015.89 oz

## 2022-04-27 LAB — GLUCOSE, CAPILLARY: Glucose-Capillary: 111 mg/dL — ABNORMAL HIGH (ref 70–99)

## 2022-04-27 LAB — PROTIME-INR
INR: 1 (ref 0.8–1.2)
Prothrombin Time: 13 seconds (ref 11.4–15.2)

## 2022-04-27 LAB — TROPONIN I (HIGH SENSITIVITY): Troponin I (High Sensitivity): 11 ng/L (ref <18)

## 2022-04-27 MED ORDER — PANTOPRAZOLE SODIUM 40 MG PO TBEC
40.0000 mg | DELAYED_RELEASE_TABLET | Freq: Two times a day (BID) | ORAL | Status: DC
  Administered 2022-04-27 – 2022-04-28 (×3): 40 mg via ORAL
  Filled 2022-04-27 (×3): qty 1

## 2022-04-27 MED ORDER — ATENOLOL 25 MG PO TABS
25.0000 mg | ORAL_TABLET | Freq: Every day | ORAL | Status: DC
  Administered 2022-04-28: 25 mg via ORAL
  Filled 2022-04-27 (×2): qty 1

## 2022-04-27 MED ORDER — POTASSIUM CHLORIDE 20 MEQ PO PACK
40.0000 meq | PACK | Freq: Two times a day (BID) | ORAL | Status: AC
  Administered 2022-04-27 (×2): 40 meq via ORAL
  Filled 2022-04-27 (×2): qty 2

## 2022-04-27 MED ORDER — MAGNESIUM CHLORIDE 64 MG PO TBEC
64.0000 mg | DELAYED_RELEASE_TABLET | Freq: Every day | ORAL | Status: DC
  Administered 2022-04-27 – 2022-04-28 (×2): 64 mg via ORAL
  Filled 2022-04-27 (×3): qty 1

## 2022-04-27 NOTE — Care Management Obs Status (Signed)
[facility] NOTIFICATION   Patient Details  Name: AIDEN RAO MRN: [id number] Date of Birth: 06-24-1941   Medicare Observation Status Notification Given:  Yes    Dawayne Patricia, RN 04/27/2022, 4:21 PM

## 2022-04-27 NOTE — Progress Notes (Signed)
Hold atenolol 25 mg tablet due to HR=40s-50s. Notified MD.   Lavenia Atlas, RN

## 2022-04-27 NOTE — Progress Notes (Signed)
Echocardiogram 2D Echocardiogram has been performed.  Oneal Deputy Reynald Woods RDCS 04/27/2022, 12:08 PM

## 2022-04-27 NOTE — Progress Notes (Signed)
[facility] PROGRESS NOTE    Progress Note  Katelyn Moore  NID:[id number] DOB: Jul 04, 1941 DOA: 04/26/2022 PCP: Mckinley Jewel, MD     Brief Narrative:   Katelyn Moore is an 81 y.o. female past medical history significant for essential hypertension, migraine and melanoma presents with substernal chest pain, she relates she started having jaw pain 2 days prior to admission, then substernal chest pain on the day of admission nonradiating feels more like crampy, patient went for workup which did not exacerbate her pain little discomfort when eating.   Assessment/Plan:   Atypical Chest pain Twelve-lead EKG showed normal sinus rhythm, normal axis nonspecific T wave changes intervals are preserved, cardiac biomarkers have been negative x 2. Cardiology was consulted, placed n.p.o. for possible stress test.  Resume home dose of aspirin. More concerned about GERD versus gastritis she has been taking ibuprofen for jaw pain. Started on Protonix twice a day.  GERD without esophagitis: She relates her pain is a little bit worse with foods, has been using ibuprofen. GI cocktail was given. Started on Protonix p.o. twice daily.  Essential hypertension: Blood pressure is well-controlled resume atenolol low-dose continue to hold diuretic therapy.  Migraine without aura, not intractable Noted, none.   DVT prophylaxis: lovenox Family Communication:none Status is: Observation The patient remains OBS appropriate and will d/c before 2 midnights.    Code Status:     Code Status Orders  (From admission, onward)           Start     Ordered   04/26/22 1812  Full code  Continuous        04/26/22 1814           Code Status History     This patient has a current code status but no historical code status.         IV Access:   Peripheral IV   Procedures and diagnostic studies:   DG Chest 2 View  Result Date: 04/26/2022 CLINICAL DATA:  Chest pain. EXAM:  CHEST - 2 VIEW COMPARISON:  October 02, 2009. FINDINGS: The heart size and mediastinal contours are within normal limits. Both lungs are clear. The visualized skeletal structures are unremarkable. IMPRESSION: No active cardiopulmonary disease. Electronically Signed   By: Marijo Conception M.D.   On: 04/26/2022 08:26     Medical Consultants:   None.   Subjective:    Katelyn Moore chest pain and abdominal pain has resolved.  None overnight.  Objective:    Vitals:   04/26/22 1548 04/26/22 1929 04/26/22 2311 04/27/22 0335  BP: (!) 143/66 119/62 (!) 112/56 (!) 125/53  Pulse: (!) 52 60 (!) 55 (!) 55  Resp: 16 (!) '22 15 16  '$ Temp: 97.6 F (36.4 C) 97.7 F (36.5 C) 98 F (36.7 C) 97.7 F (36.5 C)  TempSrc: Oral Oral Oral Oral  SpO2: 98% 94% 95% 99%  Weight: 57.2 kg     Height: 5' (1.524 m)      SpO2: 99 %   Intake/Output Summary (Last 24 hours) at 04/27/2022 0740 Last data filed at 04/26/2022 2310 Gross per 24 hour  Intake 440 ml  Output 1 ml  Net 439 ml   Filed Weights   04/26/22 0747 04/26/22 1548  Weight: 59.4 kg 57.2 kg    Exam: General exam: In no acute distress. Respiratory system: Good air movement and clear to auscultation. Cardiovascular system: S1 & S2 heard, RRR. No JVD. Gastrointestinal system: Positive bowel sounds soft  nontender nondistended Extremities: No pedal edema. Skin: No rashes, lesions or ulcers Psychiatry: Judgement and insight appear normal. Mood & affect appropriate.    Data Reviewed:    Labs: Basic Metabolic Panel: Recent Labs  Lab 04/26/22 0754 04/26/22 1852 04/27/22 0257  NA 134*  --  131*  K 3.8  --  3.3*  CL 93*  --  95*  CO2 29  --  28  GLUCOSE 118*  --  112*  BUN 18  --  17  CREATININE 0.77 1.20* 0.87  CALCIUM 10.1  --  8.6*   GFR Estimated Creatinine Clearance: 40.9 mL/min (by C-G formula based on SCr of 0.87 mg/dL). Liver Function Tests: Recent Labs  Lab 04/26/22 0754  AST 25  ALT 25  ALKPHOS 67  BILITOT 0.8   PROT 6.9  ALBUMIN 4.4   Recent Labs  Lab 04/26/22 0754  LIPASE 23   No results for input(s): "AMMONIA" in the last 168 hours. Coagulation profile Recent Labs  Lab 04/27/22 0257  INR 1.0   COVID-19 Labs  No results for input(s): "DDIMER", "FERRITIN", "LDH", "CRP" in the last 72 hours.  No results found for: "SARSCOV2NAA"  CBC: Recent Labs  Lab 04/26/22 0754 04/26/22 1852 04/27/22 0257  WBC 9.2 12.1* 8.9  HGB 13.7 13.4 12.9  HCT 39.9 39.0 36.4  MCV 90.9 91.3 90.8  PLT 233 239 220   Cardiac Enzymes: No results for input(s): "CKTOTAL", "CKMB", "CKMBINDEX", "TROPONINI" in the last 168 hours. BNP (last 3 results) No results for input(s): "PROBNP" in the last 8760 hours. CBG: No results for input(s): "GLUCAP" in the last 168 hours. D-Dimer: No results for input(s): "DDIMER" in the last 72 hours. Hgb A1c: Recent Labs    04/27/22 0257  HGBA1C 5.3   Lipid Profile: Recent Labs    04/27/22 0257  CHOL 157  HDL 51  LDLCALC 89  TRIG 85  CHOLHDL 3.1   Thyroid function studies: No results for input(s): "TSH", "T4TOTAL", "T3FREE", "THYROIDAB" in the last 72 hours.  Invalid input(s): "FREET3" Anemia work up: No results for input(s): "VITAMINB12", "FOLATE", "FERRITIN", "TIBC", "IRON", "RETICCTPCT" in the last 72 hours. Sepsis Labs: Recent Labs  Lab 04/26/22 0754 04/26/22 1852 04/27/22 0257  WBC 9.2 12.1* 8.9   Microbiology No results found for this or any previous visit (from the past 240 hour(s)).   Medications:    aspirin EC  81 mg Oral Daily   enoxaparin (LOVENOX) injection  40 mg Subcutaneous Q24H   pantoprazole  40 mg Oral Daily   Continuous Infusions:    LOS: 0 days   Charlynne Cousins  [facility]  04/27/2022, 7:40 AM

## 2022-04-27 NOTE — Consult Note (Signed)
Cardiology Consultation:   Patient ID: [name] MRN: [id number]; DOB: [birth year]  Admit date: 04/26/2022 Date of Consult: 04/27/2022  PCP:  Mckinley Jewel, MD   [facility] Providers Cardiologist: Dr. Quay Burow Click here to update MD or APP on Care Team, Refresh:1}     Patient Profile:   Katelyn Moore is a 81 y.o. female with a hx of hypertension who is being seen 04/27/2022 for the evaluation of chest pain at the request of Dr. Olevia Bowens.  History of Present Illness:   Katelyn Moore is a 81 year old fit appearing married Caucasian female mother of 2 children without prior cardiac history.  She does have a history of hypertension on beta-blocker.  She has no other cardiac risk factors.  She began to have chest pain this past Monday which was waxing and waning.  It continued yesterday and she was seen at [facility] and was transferred here.  She has been pain-free since.  Her EKG shows no changes and her enzymes are negative.   Past Medical History:  Diagnosis Date   Cancer ([place])    Skin cancer - basal cell removed   Headache(784.0)    Hypertension    Melanoma ([place])     Past Surgical History:  Procedure Laterality Date   ABDOMINAL HYSTERECTOMY     BREAST LUMPECTOMY WITH RADIOACTIVE SEED LOCALIZATION Left 11/24/2014   Procedure: BREAST LUMPECTOMY WITH RADIOACTIVE SEED LOCALIZATION;  Surgeon: Fanny Skates, MD;  Location: [place];  Service: General;  Laterality: Left;   [place] Fundal plication     TONSILLECTOMY     TUBAL LIGATION       Home Medications:  Prior to Admission medications   Medication Sig Start Date End Date Taking? Authorizing Provider  atenolol-chlorthalidone (TENORETIC) 100-25 MG tablet  10/15/16  Yes [provider]  Calcium Carb-Cholecalciferol (CALCIUM + D3 PO) Take 333 mg by mouth 3 (three) times daily before meals.   Yes [provider]  magnesium 30 MG tablet Take 30 mg by mouth 2 (two)  times daily.   Yes [provider]  Omega-3 Fatty Acids (FISH OIL PO) Take 1,000 mg by mouth once.   Yes [provider]  rizatriptan (MAXALT) 10 MG tablet Take 1 tablet (10 mg total) by mouth as needed for migraine. May repeat in 2 hours if needed 11/19/21  Yes Teague Bobbye Morton, PA-C  TURMERIC PO Take by mouth.   Yes [provider]  UBRELVY 100 MG TABS TAKE 1 TABLET BY MOUTH AS NEEDED FOR HEADACHE 12/07/21  Yes Teague Bobbye Morton, PA-C  vitamin B-12 (CYANOCOBALAMIN) 100 MCG tablet Take 100 mcg by mouth daily.   Yes [provider]  aspirin EC 81 MG tablet Take 81 mg by mouth daily.    [provider]    Inpatient Medications: Scheduled Meds:  aspirin EC  81 mg Oral Daily   atenolol  25 mg Oral Daily   enoxaparin (LOVENOX) injection  40 mg Subcutaneous Q24H   magnesium chloride  64 mg Oral Daily   pantoprazole  40 mg Oral BID   potassium chloride  40 mEq Oral BID   Continuous Infusions:  PRN Meds: acetaminophen **OR** acetaminophen, hydrALAZINE, nitroGLYCERIN, ondansetron **OR** ondansetron (ZOFRAN) IV, polyethylene glycol  Allergies:    Allergies  Allergen Reactions   Codeine Other (See Comments)    headaches   Tape Other (See Comments)    Blisters from tape from  Breast biopsy   Morphine  And Related Rash    Social History:   Social History   Socioeconomic History   Marital status: Married    Spouse name: Not on file   Number of children: Not on file   Years of education: Not on file   Highest education level: Not on file  Occupational History   Not on file  Tobacco Use   Smoking status: Never   Smokeless tobacco: Never  Vaping Use   Vaping Use: Never used  Substance and Sexual Activity   Alcohol use: Yes    Comment: Drinks rarely probably  1 glass of wine a year   Drug use: No   Sexual activity: Not on file  Other Topics Concern   Not on file  Social History Narrative   Not on file   Social Determinants of  Health   Financial Resource Strain: Not on file  Food Insecurity: Not on file  Transportation Needs: Not on file  Physical Activity: Not on file  Stress: Not on file  Social Connections: Not on file  Intimate Partner Violence: Not on file    Family History:   History reviewed. No pertinent family history.   ROS:  Please see the history of present illness.   All other ROS reviewed and negative.     Physical Exam/Data:   Vitals:   04/26/22 2311 04/27/22 0335 04/27/22 0810 04/27/22 1237  BP: (!) 112/56 (!) 125/53 131/63 (!) 122/52  Pulse: (!) 55 (!) 55 (!) 51 (!) 48  Resp: '15 16 16 16  '$ Temp: 98 F (36.7 C) 97.7 F (36.5 C) 97.6 F (36.4 C) (!) 97.5 F (36.4 C)  TempSrc: Oral Oral Oral Oral  SpO2: 95% 99% 96% 97%  Weight:      Height:        Intake/Output Summary (Last 24 hours) at 04/27/2022 1604 Last data filed at 04/26/2022 2310 Gross per 24 hour  Intake 440 ml  Output 1 ml  Net 439 ml      04/26/2022    3:48 PM 04/26/2022    7:47 AM 11/01/2019    8:14 AM  Last 3 Weights  Weight (lbs) 125 lb 15.9 oz 131 lb 131 lb  Weight (kg) 57.15 kg 59.421 kg 59.421 kg     Body mass index is 24.61 kg/m.  General:  Well nourished, well developed, in no acute distress HEENT: normal Neck: no JVD Vascular: No carotid bruits; Distal pulses 2+ bilaterally Cardiac:  normal S1, S2; RRR; no murmur  Lungs:  clear to auscultation bilaterally, no wheezing, rhonchi or rales  Abd: soft, nontender, no hepatomegaly  Ext: no edema Musculoskeletal:  No deformities, BUE and BLE strength normal and equal Skin: warm and dry  Neuro:  CNs 2-12 intact, no focal abnormalities noted Psych:  Normal affect   EKG:  The EKG was personally reviewed and demonstrates: Sinus bradycardia 55 without ST or T wave changes Telemetry:  Telemetry was personally reviewed and demonstrates: Sinus rhythm  Relevant CV Studies: 2D echocardiogram (10/28/2021)  IMPRESSIONS     1. Left ventricular ejection fraction,  by estimation, is 60 to 65%. The  left ventricle has normal function. The left ventricle has no regional  wall motion abnormalities. Left ventricular diastolic parameters are  consistent with Grade I diastolic  dysfunction (impaired relaxation).   2. Right ventricular systolic function is normal. The right ventricular  size is mildly enlarged. There is mildly elevated pulmonary artery  systolic pressure.   3. Left atrial size was  mildly dilated.   4. The mitral valve is normal in structure. Trivial mitral valve  regurgitation. No evidence of mitral stenosis.   5. The aortic valve is normal in structure. Aortic valve regurgitation is  mild to moderate. No aortic stenosis is present.   6. The inferior vena cava is normal in size with greater than 50%  respiratory variability, suggesting right atrial pressure of 3 mmHg.   Laboratory Data:  High Sensitivity Troponin:   Recent Labs  Lab 04/26/22 0754 04/26/22 1007 04/27/22 0257  TROPONINIHS '5 4 11     '$ Chemistry Recent Labs  Lab 04/26/22 0754 04/26/22 1852 04/27/22 0257  NA 134*  --  131*  K 3.8  --  3.3*  CL 93*  --  95*  CO2 29  --  28  GLUCOSE 118*  --  112*  BUN 18  --  17  CREATININE 0.77 1.20* 0.87  CALCIUM 10.1  --  8.6*  GFRNONAA >60 46* >60  ANIONGAP 12  --  8    Recent Labs  Lab 04/26/22 0754  PROT 6.9  ALBUMIN 4.4  AST 25  ALT 25  ALKPHOS 67  BILITOT 0.8   Lipids  Recent Labs  Lab 04/27/22 0257  CHOL 157  TRIG 85  HDL 51  LDLCALC 89  CHOLHDL 3.1    Hematology Recent Labs  Lab 04/26/22 0754 04/26/22 1852 04/27/22 0257  WBC 9.2 12.1* 8.9  RBC 4.39 4.27 4.01  HGB 13.7 13.4 12.9  HCT 39.9 39.0 36.4  MCV 90.9 91.3 90.8  MCH 31.2 31.4 32.2  MCHC 34.3 34.4 35.4  RDW 12.1 12.0 11.9  PLT 233 239 220   Thyroid No results for input(s): "TSH", "FREET4" in the last 168 hours.  BNPNo results for input(s): "BNP", "PROBNP" in the last 168 hours.  DDimer No results for input(s): "DDIMER" in the  last 168 hours.   Radiology/Studies:  ECHOCARDIOGRAM COMPLETE  Result Date: 04/27/2022    ECHOCARDIOGRAM REPORT   Patient Name:   YVETT ROSSEL [facility] Date of Exam: 04/27/2022 Medical Rec #:  [id number]           Height:       60.0 in Accession #:    [id number]          Weight:       126.0 lb Date of Birth:  Jan 01, 1941           BSA:          1.534 m Patient Age:    17 years            BP:           131/63 mmHg Patient Gender: F                   HR:           52 bpm. Exam Location:  Inpatient Procedure: 2D Echo, Color Doppler and Cardiac Doppler Indications:    R94.31 Abnormal EKG  History:        Patient has no prior history of Echocardiogram examinations.                 Risk Factors:Hypertension and Dyslipidemia.  Sonographer:    Raquel Sarna Senior RDCS Referring Phys: [id number] [place]  1. Left ventricular ejection fraction, by estimation, is 60 to 65%. The left ventricle has normal function. The left ventricle has no regional wall motion abnormalities. Left ventricular diastolic parameters are consistent with Grade I  diastolic dysfunction (impaired relaxation).  2. Right ventricular systolic function is normal. The right ventricular size is mildly enlarged. There is mildly elevated pulmonary artery systolic pressure.  3. Left atrial size was mildly dilated.  4. The mitral valve is normal in structure. Trivial mitral valve regurgitation. No evidence of mitral stenosis.  5. The aortic valve is normal in structure. Aortic valve regurgitation is mild to moderate. No aortic stenosis is present.  6. The inferior vena cava is normal in size with greater than 50% respiratory variability, suggesting right atrial pressure of 3 mmHg. FINDINGS  Left Ventricle: Left ventricular ejection fraction, by estimation, is 60 to 65%. The left ventricle has normal function. The left ventricle has no regional wall motion abnormalities. The left ventricular internal cavity size was normal in size. There is  no left  ventricular hypertrophy. Left ventricular diastolic parameters are consistent with Grade I diastolic dysfunction (impaired relaxation). Right Ventricle: The right ventricular size is mildly enlarged. No increase in right ventricular wall thickness. Right ventricular systolic function is normal. There is mildly elevated pulmonary artery systolic pressure. The tricuspid regurgitant velocity is 2.90 m/s, and with an assumed right atrial pressure of 3 mmHg, the estimated right ventricular systolic pressure is 54.6 mmHg. Left Atrium: Left atrial size was mildly dilated. Right Atrium: Right atrial size was normal in size. Pericardium: There is no evidence of pericardial effusion. Mitral Valve: The mitral valve is normal in structure. Trivial mitral valve regurgitation. No evidence of mitral valve stenosis. Tricuspid Valve: The tricuspid valve is normal in structure. Tricuspid valve regurgitation is mild . No evidence of tricuspid stenosis. Aortic Valve: The aortic valve is normal in structure. Aortic valve regurgitation is mild to moderate. Aortic regurgitation PHT measures 584 msec. No aortic stenosis is present. Pulmonic Valve: The pulmonic valve was normal in structure. Pulmonic valve regurgitation is not visualized. No evidence of pulmonic stenosis. Aorta: The aortic root is normal in size and structure. Venous: The inferior vena cava is normal in size with greater than 50% respiratory variability, suggesting right atrial pressure of 3 mmHg. IAS/Shunts: No atrial level shunt detected by color flow Doppler.  LEFT VENTRICLE PLAX 2D LVIDd:         5.10 cm   Diastology LVIDs:         3.40 cm   LV e' medial:    6.22 cm/s LV PW:         0.90 cm   LV E/e' medial:  9.4 LV IVS:        0.80 cm   LV e' lateral:   12.60 cm/s LVOT diam:     1.90 cm   LV E/e' lateral: 4.6 LV SV:         72 LV SV Index:   47 LVOT Area:     2.84 cm  RIGHT VENTRICLE RV S prime:     14.70 cm/s TAPSE (M-mode): 2.6 cm LEFT ATRIUM             Index         RIGHT ATRIUM           Index LA diam:        3.40 cm 2.22 cm/m   RA Area:     13.90 cm LA Vol (A2C):   62.7 ml 40.88 ml/m  RA Volume:   35.40 ml  23.08 ml/m LA Vol (A4C):   51.2 ml 33.38 ml/m LA Biplane Vol: 59.2 ml 38.60 ml/m  AORTIC VALVE LVOT Vmax:  94.50 cm/s LVOT Vmean:  66.900 cm/s LVOT VTI:    0.254 m AI PHT:      584 msec  AORTA Ao Root diam: 2.90 cm Ao Asc diam:  3.20 cm MITRAL VALVE               TRICUSPID VALVE MV Area (PHT): 3.39 cm    TR Peak grad:   33.6 mmHg MV Decel Time: 224 msec    TR Vmax:        290.00 cm/s MV E velocity: 58.20 cm/s MV A velocity: 65.00 cm/s  SHUNTS MV E/A ratio:  0.90        Systemic VTI:  0.25 m                            Systemic Diam: 1.90 cm Godfrey Pick Tobb DO Electronically signed by Berniece Salines DO Signature Date/Time: 04/27/2022/12:20:01 PM    Final    DG Chest 2 View  Result Date: 04/26/2022 CLINICAL DATA:  Chest pain. EXAM: CHEST - 2 VIEW COMPARISON:  October 02, 2009. FINDINGS: The heart size and mediastinal contours are within normal limits. Both lungs are clear. The visualized skeletal structures are unremarkable. IMPRESSION: No active cardiopulmonary disease. Electronically Signed   By: Marijo Conception M.D.   On: 04/26/2022 08:26     Assessment and Plan:   Chest pain-atypical chest pain.  This sounds more gastrointestinal.  Her echo is entirely normal.  Her EKG shows no acute changes and her enzymes are normal as well.  I think she would benefit from having a coronary CTA to rule out CAD as an etiology.   Risk Assessment/Risk Scores:     HEAR Score (for undifferentiated chest pain):             For questions or updates, please contact [place] Please consult [email or web address] for contact info under    Signed, Quay Burow, MD  04/27/2022 4:04 PM

## 2022-04-28 ENCOUNTER — Other Ambulatory Visit (HOSPITAL_COMMUNITY): Payer: Self-pay

## 2022-04-28 ENCOUNTER — Observation Stay (HOSPITAL_BASED_OUTPATIENT_CLINIC_OR_DEPARTMENT_OTHER): Payer: Medicare Other

## 2022-04-28 DIAGNOSIS — K219 Gastro-esophageal reflux disease without esophagitis: Secondary | ICD-10-CM | POA: Diagnosis not present

## 2022-04-28 DIAGNOSIS — R079 Chest pain, unspecified: Secondary | ICD-10-CM | POA: Diagnosis not present

## 2022-04-28 DIAGNOSIS — I2510 Atherosclerotic heart disease of native coronary artery without angina pectoris: Secondary | ICD-10-CM | POA: Diagnosis not present

## 2022-04-28 DIAGNOSIS — G43009 Migraine without aura, not intractable, without status migrainosus: Secondary | ICD-10-CM

## 2022-04-28 DIAGNOSIS — I10 Essential (primary) hypertension: Secondary | ICD-10-CM | POA: Diagnosis not present

## 2022-04-28 LAB — BASIC METABOLIC PANEL
Anion gap: 6 (ref 5–15)
BUN: 17 mg/dL (ref 8–23)
CO2: 26 mmol/L (ref 22–32)
Calcium: 8.7 mg/dL — ABNORMAL LOW (ref 8.9–10.3)
Chloride: 101 mmol/L (ref 98–111)
Creatinine, Ser: 0.76 mg/dL (ref 0.44–1.00)
GFR, Estimated: >60 mL/min (ref >60)
Glucose, Bld: 103 mg/dL — ABNORMAL HIGH (ref 70–99)
Potassium: 4.2 mmol/L (ref 3.5–5.1)
Sodium: 133 mmol/L — ABNORMAL LOW (ref 135–145)

## 2022-04-28 LAB — GLUCOSE, CAPILLARY: Glucose-Capillary: 111 mg/dL — ABNORMAL HIGH (ref 70–99)

## 2022-04-28 MED ORDER — METOPROLOL TARTRATE 5 MG/5ML IV SOLN: 5.0000 mg | Freq: Once | INTRAVENOUS | Status: AC

## 2022-04-28 MED ORDER — IOHEXOL 350 MG/ML SOLN
100.0000 mL | Freq: Once | INTRAVENOUS | Status: AC | PRN
  Administered 2022-04-28: 100 mL via INTRAVENOUS

## 2022-04-28 MED ORDER — NITROGLYCERIN 0.4 MG SL SUBL: 0.8000 mg | SUBLINGUAL_TABLET | Freq: Once | SUBLINGUAL | Status: AC

## 2022-04-28 MED ORDER — OMEPRAZOLE 20 MG PO CPDR
20.0000 mg | DELAYED_RELEASE_CAPSULE | Freq: Two times a day (BID) | ORAL | 0 refills | Status: AC
  Filled 2022-04-28: qty 60, 30d supply, fill #0

## 2022-04-28 MED ORDER — ROSUVASTATIN CALCIUM 20 MG PO TABS
20.0000 mg | ORAL_TABLET | Freq: Every day | ORAL | 3 refills | Status: AC
  Filled 2022-04-28: qty 30, 30d supply, fill #0

## 2022-04-28 MED ORDER — NITROGLYCERIN 0.4 MG SL SUBL
SUBLINGUAL_TABLET | SUBLINGUAL | Status: AC
  Administered 2022-04-28: 0.8 mg via SUBLINGUAL
  Filled 2022-04-28: qty 2

## 2022-04-28 MED ORDER — METOPROLOL TARTRATE 5 MG/5ML IV SOLN
INTRAVENOUS | Status: AC
  Administered 2022-04-28: 5 mg via INTRAVENOUS
  Filled 2022-04-28: qty 5

## 2022-04-28 MED ORDER — ROSUVASTATIN CALCIUM 20 MG PO TABS: 20.0000 mg | ORAL_TABLET | Freq: Every day | ORAL | Status: DC

## 2022-04-28 NOTE — Progress Notes (Signed)
[facility] PROGRESS NOTE    Progress Note  Katelyn Moore  AST:[id number] DOB: 1941/05/13 DOA: 04/26/2022 PCP: Mckinley Jewel, MD     Brief Narrative:   Katelyn Moore is an 81 y.o. female past medical history significant for essential hypertension, migraine and melanoma presents with substernal chest pain, she relates she started having jaw pain 2 days prior to admission, then substernal chest pain on the day of admission nonradiating feels more like crampy, patient went for workup which did not exacerbate her pain little discomfort when eating.   Assessment/Plan:   Atypical Chest pain Cardiology was consulted, recommended coronary CT. Concern about GERD versus gastritis been taking ibuprofen.  Continue Protonix twice a day.  GERD without esophagitis: She relates her pain is a little bit worse with foods, has been using ibuprofen. GI cocktail was given. Continue Protonix p.o. twice daily.  Essential hypertension: Blood pressure is well-controlled resume atenolol low-dose continue to hold diuretic therapy.  Migraine without aura, not intractable Noted, none.   DVT prophylaxis: lovenox Family Communication:none Status is: Observation The patient remains OBS appropriate and will d/c before 2 midnights.    Code Status:     Code Status Orders  (From admission, onward)           Start     Ordered   04/26/22 1812  Full code  Continuous        04/26/22 1814           Code Status History     This patient has a current code status but no historical code status.         IV Access:   Peripheral IV   Procedures and diagnostic studies:   ECHOCARDIOGRAM COMPLETE  Result Date: 04/27/2022    ECHOCARDIOGRAM REPORT   Patient Name:   Katelyn Moore Date of Exam: 04/27/2022 Medical Rec #:  [id number]           Height:       60.0 in Accession #:    [id number]          Weight:       126.0 lb Date of Birth:  08-23-1941           BSA:           1.534 m Patient Age:    85 years            BP:           131/63 mmHg Patient Gender: F                   HR:           52 bpm. Exam Location:  Inpatient Procedure: 2D Echo, Color Doppler and Cardiac Doppler Indications:    R94.31 Abnormal EKG  History:        Patient has no prior history of Echocardiogram examinations.                 Risk Factors:Hypertension and Dyslipidemia.  Sonographer:    Raquel Sarna Senior RDCS Referring Phys: [id number] [place]  1. Left ventricular ejection fraction, by estimation, is 60 to 65%. The left ventricle has normal function. The left ventricle has no regional wall motion abnormalities. Left ventricular diastolic parameters are consistent with Grade I diastolic dysfunction (impaired relaxation).  2. Right ventricular systolic function is normal. The right ventricular size is mildly enlarged. There is mildly elevated pulmonary artery systolic pressure.  3. Left atrial size was  mildly dilated.  4. The mitral valve is normal in structure. Trivial mitral valve regurgitation. No evidence of mitral stenosis.  5. The aortic valve is normal in structure. Aortic valve regurgitation is mild to moderate. No aortic stenosis is present.  6. The inferior vena cava is normal in size with greater than 50% respiratory variability, suggesting right atrial pressure of 3 mmHg. FINDINGS  Left Ventricle: Left ventricular ejection fraction, by estimation, is 60 to 65%. The left ventricle has normal function. The left ventricle has no regional wall motion abnormalities. The left ventricular internal cavity size was normal in size. There is  no left ventricular hypertrophy. Left ventricular diastolic parameters are consistent with Grade I diastolic dysfunction (impaired relaxation). Right Ventricle: The right ventricular size is mildly enlarged. No increase in right ventricular wall thickness. Right ventricular systolic function is normal. There is mildly elevated pulmonary artery systolic  pressure. The tricuspid regurgitant velocity is 2.90 m/s, and with an assumed right atrial pressure of 3 mmHg, the estimated right ventricular systolic pressure is 47.4 mmHg. Left Atrium: Left atrial size was mildly dilated. Right Atrium: Right atrial size was normal in size. Pericardium: There is no evidence of pericardial effusion. Mitral Valve: The mitral valve is normal in structure. Trivial mitral valve regurgitation. No evidence of mitral valve stenosis. Tricuspid Valve: The tricuspid valve is normal in structure. Tricuspid valve regurgitation is mild . No evidence of tricuspid stenosis. Aortic Valve: The aortic valve is normal in structure. Aortic valve regurgitation is mild to moderate. Aortic regurgitation PHT measures 584 msec. No aortic stenosis is present. Pulmonic Valve: The pulmonic valve was normal in structure. Pulmonic valve regurgitation is not visualized. No evidence of pulmonic stenosis. Aorta: The aortic root is normal in size and structure. Venous: The inferior vena cava is normal in size with greater than 50% respiratory variability, suggesting right atrial pressure of 3 mmHg. IAS/Shunts: No atrial level shunt detected by color flow Doppler.  LEFT VENTRICLE PLAX 2D LVIDd:         5.10 cm   Diastology LVIDs:         3.40 cm   LV e' medial:    6.22 cm/s LV PW:         0.90 cm   LV E/e' medial:  9.4 LV IVS:        0.80 cm   LV e' lateral:   12.60 cm/s LVOT diam:     1.90 cm   LV E/e' lateral: 4.6 LV SV:         72 LV SV Index:   47 LVOT Area:     2.84 cm  RIGHT VENTRICLE RV S prime:     14.70 cm/s TAPSE (M-mode): 2.6 cm LEFT ATRIUM             Index        RIGHT ATRIUM           Index LA diam:        3.40 cm 2.22 cm/m   RA Area:     13.90 cm LA Vol (A2C):   62.7 ml 40.88 ml/m  RA Volume:   35.40 ml  23.08 ml/m LA Vol (A4C):   51.2 ml 33.38 ml/m LA Biplane Vol: 59.2 ml 38.60 ml/m  AORTIC VALVE LVOT Vmax:   94.50 cm/s LVOT Vmean:  66.900 cm/s LVOT VTI:    0.254 m AI PHT:      584 msec  AORTA  Ao Root diam: 2.90 cm Ao Asc  diam:  3.20 cm MITRAL VALVE               TRICUSPID VALVE MV Area (PHT): 3.39 cm    TR Peak grad:   33.6 mmHg MV Decel Time: 224 msec    TR Vmax:        290.00 cm/s MV E velocity: 58.20 cm/s MV A velocity: 65.00 cm/s  SHUNTS MV E/A ratio:  0.90        Systemic VTI:  0.25 m                            Systemic Diam: 1.90 cm Kardie Tobb DO Electronically signed by Berniece Salines DO Signature Date/Time: 04/27/2022/12:20:01 PM    Final      Medical Consultants:   None.   Subjective:    Katelyn Moore chest pain overnight.  Objective:    Vitals:   04/27/22 2344 04/28/22 0638 04/28/22 0746 04/28/22 0902  BP: (!) 113/55 (!) 125/57 120/61   Pulse: 62 68 60 68  Resp: '20 18 16   '$ Temp: 97.8 F (36.6 C) (!) 97.5 F (36.4 C) 97.7 F (36.5 C)   TempSrc: Oral Oral Oral   SpO2: 98% 91% 97%   Weight:      Height:       SpO2: 97 %   Intake/Output Summary (Last 24 hours) at 04/28/2022 0939 Last data filed at 04/28/2022 0905 Gross per 24 hour  Intake 480 ml  Output --  Net 480 ml    Filed Weights   04/26/22 0747 04/26/22 1548  Weight: 59.4 kg 57.2 kg    Exam: General exam: In no acute distress. Respiratory system: Good air movement and clear to auscultation. Cardiovascular system: S1 & S2 heard, RRR. No JVD. Gastrointestinal system: Abdomen is nondistended, soft and nontender.  Extremities: No pedal edema. Skin: No rashes, lesions or ulcers Psychiatry: Judgement and insight appear normal. Mood & affect appropriate.  Data Reviewed:    Labs: Basic Metabolic Panel: Recent Labs  Lab 04/26/22 0754 04/26/22 1852 04/27/22 0257 04/28/22 0323  NA 134*  --  131* 133*  K 3.8  --  3.3* 4.2  CL 93*  --  95* 101  CO2 29  --  28 26  GLUCOSE 118*  --  112* 103*  BUN 18  --  17 17  CREATININE 0.77 1.20* 0.87 0.76  CALCIUM 10.1  --  8.6* 8.7*    GFR Estimated Creatinine Clearance: 44.4 mL/min (by C-G formula based on SCr of 0.76 mg/dL). Liver  Function Tests: Recent Labs  Lab 04/26/22 0754  AST 25  ALT 25  ALKPHOS 67  BILITOT 0.8  PROT 6.9  ALBUMIN 4.4    Recent Labs  Lab 04/26/22 0754  LIPASE 23    No results for input(s): "AMMONIA" in the last 168 hours. Coagulation profile Recent Labs  Lab 04/27/22 0257  INR 1.0    COVID-19 Labs  No results for input(s): "DDIMER", "FERRITIN", "LDH", "CRP" in the last 72 hours.  No results found for: "SARSCOV2NAA"  CBC: Recent Labs  Lab 04/26/22 0754 04/26/22 1852 04/27/22 0257  WBC 9.2 12.1* 8.9  HGB 13.7 13.4 12.9  HCT 39.9 39.0 36.4  MCV 90.9 91.3 90.8  PLT 233 239 220    Cardiac Enzymes: No results for input(s): "CKTOTAL", "CKMB", "CKMBINDEX", "TROPONINI" in the last 168 hours. BNP (last 3 results) No results for input(s): "PROBNP" in the last 8760  hours. CBG: Recent Labs  Lab 04/27/22 0935 04/28/22 0743  GLUCAP 111* 111*   D-Dimer: No results for input(s): "DDIMER" in the last 72 hours. Hgb A1c: Recent Labs    04/27/22 0257  HGBA1C 5.3    Lipid Profile: Recent Labs    04/27/22 0257  CHOL 157  HDL 51  LDLCALC 89  TRIG 85  CHOLHDL 3.1    Thyroid function studies: No results for input(s): "TSH", "T4TOTAL", "T3FREE", "THYROIDAB" in the last 72 hours.  Invalid input(s): "FREET3" Anemia work up: No results for input(s): "VITAMINB12", "FOLATE", "FERRITIN", "TIBC", "IRON", "RETICCTPCT" in the last 72 hours. Sepsis Labs: Recent Labs  Lab 04/26/22 0754 04/26/22 1852 04/27/22 0257  WBC 9.2 12.1* 8.9    Microbiology No results found for this or any previous visit (from the past 240 hour(s)).   Medications:    aspirin EC  81 mg Oral Daily   atenolol  25 mg Oral Daily   enoxaparin (LOVENOX) injection  40 mg Subcutaneous Q24H   magnesium chloride  64 mg Oral Daily   metoprolol tartrate       pantoprazole  40 mg Oral BID   Continuous Infusions:    LOS: 0 days   Charlynne Cousins  [facility]  04/28/2022, 9:39 AM

## 2022-04-28 NOTE — Plan of Care (Signed)

## 2022-04-28 NOTE — Progress Notes (Addendum)
D/d tele and iv. Went over AVS with pt and all questions were addressed. Waiting to receive meds from TOC.   Lavenia Atlas, RN

## 2022-04-28 NOTE — Progress Notes (Addendum)
Progress Note  Patient Name: Katelyn Moore Date of Encounter: 04/28/2022  [facility] Cardiologist: Quay Burow, MD   Subjective   Feeling well this morning. Walking the hallways  Inpatient Medications    Scheduled Meds:  aspirin EC  81 mg Oral Daily   atenolol  25 mg Oral Daily   enoxaparin (LOVENOX) injection  40 mg Subcutaneous Q24H   magnesium chloride  64 mg Oral Daily   pantoprazole  40 mg Oral BID   Continuous Infusions:  PRN Meds: acetaminophen **OR** acetaminophen, hydrALAZINE, nitroGLYCERIN, ondansetron **OR** ondansetron (ZOFRAN) IV, polyethylene glycol   Vital Signs    Vitals:   04/27/22 2344 04/28/22 0638 04/28/22 0746 04/28/22 0902  BP: (!) 113/55 (!) 125/57 120/61   Pulse: 62 68 60 68  Resp: '20 18 16   '$ Temp: 97.8 F (36.6 C) (!) 97.5 F (36.4 C) 97.7 F (36.5 C)   TempSrc: Oral Oral Oral   SpO2: 98% 91% 97%   Weight:      Height:        Intake/Output Summary (Last 24 hours) at 04/28/2022 1002 Last data filed at 04/28/2022 0905 Gross per 24 hour  Intake 480 ml  Output --  Net 480 ml      04/26/2022    3:48 PM 04/26/2022    7:47 AM 11/01/2019    8:14 AM  Last 3 Weights  Weight (lbs) 125 lb 15.9 oz 131 lb 131 lb  Weight (kg) 57.15 kg 59.421 kg 59.421 kg      Telemetry    Sinus Rhythm - Personally Reviewed  ECG    No new tracing  Physical Exam   GEN: No acute distress.   Neck: No JVD Cardiac: RRR, no murmurs, rubs, or gallops.  Respiratory: Clear to auscultation bilaterally. GI: Soft, nontender, non-distended  MS: No edema; No deformity. Neuro:  Nonfocal  Psych: Normal affect   Labs    High Sensitivity Troponin:   Recent Labs  Lab 04/26/22 0754 04/26/22 1007 04/27/22 0257  TROPONINIHS '5 4 11     '$ Chemistry Recent Labs  Lab 04/26/22 0754 04/26/22 1852 04/27/22 0257 04/28/22 0323  NA 134*  --  131* 133*  K 3.8  --  3.3* 4.2  CL 93*  --  95* 101  CO2 29  --  28 26  GLUCOSE 118*  --  112* 103*  BUN 18   --  17 17  CREATININE 0.77 1.20* 0.87 0.76  CALCIUM 10.1  --  8.6* 8.7*  PROT 6.9  --   --   --   ALBUMIN 4.4  --   --   --   AST 25  --   --   --   ALT 25  --   --   --   ALKPHOS 67  --   --   --   BILITOT 0.8  --   --   --   GFRNONAA >60 46* >60 >60  ANIONGAP 12  --  8 6    Lipids  Recent Labs  Lab 04/27/22 0257  CHOL 157  TRIG 85  HDL 51  LDLCALC 89  CHOLHDL 3.1    Hematology Recent Labs  Lab 04/26/22 0754 04/26/22 1852 04/27/22 0257  WBC 9.2 12.1* 8.9  RBC 4.39 4.27 4.01  HGB 13.7 13.4 12.9  HCT 39.9 39.0 36.4  MCV 90.9 91.3 90.8  MCH 31.2 31.4 32.2  MCHC 34.3 34.4 35.4  RDW 12.1 12.0 11.9  PLT 233  239 220   Thyroid No results for input(s): "TSH", "FREET4" in the last 168 hours.  BNPNo results for input(s): "BNP", "PROBNP" in the last 168 hours.  DDimer No results for input(s): "DDIMER" in the last 168 hours.   Radiology    ECHOCARDIOGRAM COMPLETE  Result Date: 04/27/2022    ECHOCARDIOGRAM REPORT   Patient Name:   Katelyn Moore [facility] Date of Exam: 04/27/2022 Medical Rec #:  [id number]           Height:       60.0 in Accession #:    [id number]          Weight:       126.0 lb Date of Birth:  11-Jul-1941           BSA:          1.534 m Patient Age:    81 years            BP:           131/63 mmHg Patient Gender: F                   HR:           52 bpm. Exam Location:  Inpatient Procedure: 2D Echo, Color Doppler and Cardiac Doppler Indications:    R94.31 Abnormal EKG  History:        Patient has no prior history of Echocardiogram examinations.                 Risk Factors:Hypertension and Dyslipidemia.  Sonographer:    Raquel Sarna Senior RDCS Referring Phys: [id number] [place]  1. Left ventricular ejection fraction, by estimation, is 60 to 65%. The left ventricle has normal function. The left ventricle has no regional wall motion abnormalities. Left ventricular diastolic parameters are consistent with Grade I diastolic dysfunction (impaired relaxation).  2. Right  ventricular systolic function is normal. The right ventricular size is mildly enlarged. There is mildly elevated pulmonary artery systolic pressure.  3. Left atrial size was mildly dilated.  4. The mitral valve is normal in structure. Trivial mitral valve regurgitation. No evidence of mitral stenosis.  5. The aortic valve is normal in structure. Aortic valve regurgitation is mild to moderate. No aortic stenosis is present.  6. The inferior vena cava is normal in size with greater than 50% respiratory variability, suggesting right atrial pressure of 3 mmHg. FINDINGS  Left Ventricle: Left ventricular ejection fraction, by estimation, is 60 to 65%. The left ventricle has normal function. The left ventricle has no regional wall motion abnormalities. The left ventricular internal cavity size was normal in size. There is  no left ventricular hypertrophy. Left ventricular diastolic parameters are consistent with Grade I diastolic dysfunction (impaired relaxation). Right Ventricle: The right ventricular size is mildly enlarged. No increase in right ventricular wall thickness. Right ventricular systolic function is normal. There is mildly elevated pulmonary artery systolic pressure. The tricuspid regurgitant velocity is 2.90 m/s, and with an assumed right atrial pressure of 3 mmHg, the estimated right ventricular systolic pressure is 67.8 mmHg. Left Atrium: Left atrial size was mildly dilated. Right Atrium: Right atrial size was normal in size. Pericardium: There is no evidence of pericardial effusion. Mitral Valve: The mitral valve is normal in structure. Trivial mitral valve regurgitation. No evidence of mitral valve stenosis. Tricuspid Valve: The tricuspid valve is normal in structure. Tricuspid valve regurgitation is mild . No evidence of tricuspid stenosis. Aortic Valve: The aortic valve is normal  in structure. Aortic valve regurgitation is mild to moderate. Aortic regurgitation PHT measures 584 msec. No aortic stenosis  is present. Pulmonic Valve: The pulmonic valve was normal in structure. Pulmonic valve regurgitation is not visualized. No evidence of pulmonic stenosis. Aorta: The aortic root is normal in size and structure. Venous: The inferior vena cava is normal in size with greater than 50% respiratory variability, suggesting right atrial pressure of 3 mmHg. IAS/Shunts: No atrial level shunt detected by color flow Doppler.  LEFT VENTRICLE PLAX 2D LVIDd:         5.10 cm   Diastology LVIDs:         3.40 cm   LV e' medial:    6.22 cm/s LV PW:         0.90 cm   LV E/e' medial:  9.4 LV IVS:        0.80 cm   LV e' lateral:   12.60 cm/s LVOT diam:     1.90 cm   LV E/e' lateral: 4.6 LV SV:         72 LV SV Index:   47 LVOT Area:     2.84 cm  RIGHT VENTRICLE RV S prime:     14.70 cm/s TAPSE (M-mode): 2.6 cm LEFT ATRIUM             Index        RIGHT ATRIUM           Index LA diam:        3.40 cm 2.22 cm/m   RA Area:     13.90 cm LA Vol (A2C):   62.7 ml 40.88 ml/m  RA Volume:   35.40 ml  23.08 ml/m LA Vol (A4C):   51.2 ml 33.38 ml/m LA Biplane Vol: 59.2 ml 38.60 ml/m  AORTIC VALVE LVOT Vmax:   94.50 cm/s LVOT Vmean:  66.900 cm/s LVOT VTI:    0.254 m AI PHT:      584 msec  AORTA Ao Root diam: 2.90 cm Ao Asc diam:  3.20 cm MITRAL VALVE               TRICUSPID VALVE MV Area (PHT): 3.39 cm    TR Peak grad:   33.6 mmHg MV Decel Time: 224 msec    TR Vmax:        290.00 cm/s MV E velocity: 58.20 cm/s MV A velocity: 65.00 cm/s  SHUNTS MV E/A ratio:  0.90        Systemic VTI:  0.25 m                            Systemic Diam: 1.90 cm Kardie Tobb DO Electronically signed by Berniece Salines DO Signature Date/Time: 04/27/2022/12:20:01 PM    Final     Cardiac Studies   Echo: 04/27/22  IMPRESSIONS     1. Left ventricular ejection fraction, by estimation, is 60 to 65%. The  left ventricle has normal function. The left ventricle has no regional  wall motion abnormalities. Left ventricular diastolic parameters are  consistent with Grade I  diastolic  dysfunction (impaired relaxation).   2. Right ventricular systolic function is normal. The right ventricular  size is mildly enlarged. There is mildly elevated pulmonary artery  systolic pressure.   3. Left atrial size was mildly dilated.   4. The mitral valve is normal in structure. Trivial mitral valve  regurgitation. No evidence of mitral stenosis.   5.  The aortic valve is normal in structure. Aortic valve regurgitation is  mild to moderate. No aortic stenosis is present.   6. The inferior vena cava is normal in size with greater than 50%  respiratory variability, suggesting right atrial pressure of 3 mmHg.   FINDINGS   Left Ventricle: Left ventricular ejection fraction, by estimation, is 60  to 65%. The left ventricle has normal function. The left ventricle has no  regional wall motion abnormalities. The left ventricular internal cavity  size was normal in size. There is   no left ventricular hypertrophy. Left ventricular diastolic parameters  are consistent with Grade I diastolic dysfunction (impaired relaxation).   Right Ventricle: The right ventricular size is mildly enlarged. No  increase in right ventricular wall thickness. Right ventricular systolic  function is normal. There is mildly elevated pulmonary artery systolic  pressure. The tricuspid regurgitant  velocity is 2.90 m/s, and with an assumed right atrial pressure of 3 mmHg,  the estimated right ventricular systolic pressure is 00.3 mmHg.   Left Atrium: Left atrial size was mildly dilated.   Right Atrium: Right atrial size was normal in size.   Pericardium: There is no evidence of pericardial effusion.   Mitral Valve: The mitral valve is normal in structure. Trivial mitral  valve regurgitation. No evidence of mitral valve stenosis.   Tricuspid Valve: The tricuspid valve is normal in structure. Tricuspid  valve regurgitation is mild . No evidence of tricuspid stenosis.   Aortic Valve: The aortic valve  is normal in structure. Aortic valve  regurgitation is mild to moderate. Aortic regurgitation PHT measures 584  msec. No aortic stenosis is present.   Pulmonic Valve: The pulmonic valve was normal in structure. Pulmonic valve  regurgitation is not visualized. No evidence of pulmonic stenosis.   Aorta: The aortic root is normal in size and structure.   Venous: The inferior vena cava is normal in size with greater than 50%  respiratory variability, suggesting right atrial pressure of 3 mmHg.   IAS/Shunts: No atrial level shunt detected by color flow Doppler.   Patient Profile     81 y.o. female  a hx of hypertension who was seen 04/27/2022 for the evaluation of chest pain at the request of Dr. Olevia Bowens.    Assessment & Plan    Atypical chest pain: High-sensitivity troponin 5>> 4>> 11.  She is planned for coronary CTA today.  Echocardiogram showed LVEF of 60 to 65%, no regional wall motion abnormality, grade 1 diastolic dysfunction, normal RV function, mildly enlarged RV, mildly dilated left atrium  Hypertension: Stable --continue atenolol 25 mg daily   For questions or updates, please contact [facility] Please consult [email or web address] for contact info under    Signed, Reino Bellis, NP  04/28/2022, 10:02 AM    Addendum:  Coronary CTA  IMPRESSION: 1. Coronary calcium score of 5 (LAD). This was 88 percentile for age and sex matched control.   2. Normal coronary origin with LEFT dominance.   3. RCA is a small non dominant artery. There is 50-69% moderate stenosis, soft plaque, at the ostium. Vessel is small, not amenable to PCI.   4. LAD is a large vessel that has one large branching diagonal with MILD 30-49% stenosis proximally, soft plaque. There is proximal LAD calcified plaque 0-24%.   5.  Hiatal hernia.   CAD-RADS 3. Moderate stenosis. Consider symptom-guided anti-ischemic pharmacotherapy as well as risk factor modification per guideline directed care.  Spoke  with patient regarding results and rec's  for medical management. Added statin therapy to ASA and BB. Follow up in the office arranged.    Agree with note by Reino Bellis NP-C  2D nl. CTA shows no signif CAD. Given this , doubt cardiac etiology for CP. Will need GI eval.  Lorretta Harp, M.D., [facility], [facility], FAHA, [place] [address] [place], [place]  [address]  [phone] 04/28/2022 2:14 PM

## 2022-04-28 NOTE — Discharge Summary (Signed)
Physician Discharge Summary  Katelyn Moore JHE:[id number] DOB: Jul 07, 1941 DOA: 04/26/2022  PCP: Mckinley Jewel, MD  Admit date: 04/26/2022 Discharge date: 04/28/2022  Admitted From: home Disposition:  home  Recommendations for Outpatient Follow-up:  Follow up with PCP in 1-2 weeks Please obtain BMP/CBC in one week  Home Health:no Equipment/Devices:none  Discharge Condition:Stable CODE STATUS:Full Diet recommendation: Heart Healthy   Brief/Interim Summary: 81 y.o. female past medical history significant for essential hypertension, migraine and melanoma presents with substernal chest pain, she relates she started having jaw pain 2 days prior to admission, then substernal chest pain on the day of admission nonradiating feels more like crampy, patient went for workup which did not exacerbate her pain little discomfort when eating.  Discharge Diagnoses:  Principal Problem:   Chest pain Active Problems:   Gastro-esophageal reflux disease without esophagitis   Essential hypertension   Migraine without aura, not intractable  Atypical chest pain: Twelve-lead EKG showed no signs of ischemia cardiac biomarkers been negative x 2. Cardiology was consulted CT angio of the coronaries was done that deemed her low yield. Cardiology recommended to continue statins and follow-up with PCP as an outpatient.  GERD without esophagitis: She relates her chest pain abdominal pain was worse with food she has been using ibuprofen for 24 hours prior to admission. She was given a GI cocktail started on Protonix her chest pain abdominal pain resolved she will go home and Protonix twice a day.  Essential hypertension: No change made to her medication, blood pressure remained well-controlled.  Migraine without aura: Noted.  Discharge Instructions  Discharge Instructions     Diet - low sodium heart healthy   Complete by: As directed    Increase activity slowly   Complete by: As directed        Allergies as of 04/28/2022       Reactions   Codeine Other (See Comments)   headaches   Tape Other (See Comments)   Blisters from tape from  Breast biopsy   Morphine And Related Rash        Medication List     TAKE these medications    aspirin EC 81 MG tablet Take 81 mg by mouth daily.   atenolol-chlorthalidone 100-25 MG tablet Commonly known as: TENORETIC   CALCIUM + D3 PO Take 333 mg by mouth 3 (three) times daily before meals.   FISH OIL PO Take 1,000 mg by mouth once.   magnesium 30 MG tablet Take 30 mg by mouth 2 (two) times daily.   omeprazole 20 MG capsule Commonly known as: PRILOSEC Take 1 capsule (20 mg total) by mouth 2 (two) times daily before a meal.   rizatriptan 10 MG tablet Commonly known as: Maxalt Take 1 tablet (10 mg total) by mouth as needed for migraine. May repeat in 2 hours if needed   rosuvastatin 20 MG tablet Commonly known as: CRESTOR Take 1 tablet (20 mg total) by mouth daily.   TURMERIC PO Take by mouth.   Ubrelvy 100 MG Tabs Generic drug: Ubrogepant TAKE 1 TABLET BY MOUTH AS NEEDED FOR HEADACHE   vitamin B-12 100 MCG tablet Commonly known as: CYANOCOBALAMIN Take 100 mcg by mouth daily.        Allergies  Allergen Reactions   Codeine Other (See Comments)    headaches   Tape Other (See Comments)    Blisters from tape from  Breast biopsy   Morphine And Related Rash    Consultations: Cardiology   Procedures/Studies: CT  CORONARY MORPH W/CTA COR W/SCORE W/CA W/CM &/OR WO/CM  Addendum Date: 04/28/2022   ADDENDUM REPORT: 04/28/2022 12:20 CLINICAL DATA:  This over-read does not include interpretation of cardiac or coronary anatomy or pathology. The coronary CTA interpretation by the cardiologist is attached. COMPARISON:  None available. FINDINGS: 4 mm pulmonary nodule seen in the right middle lobe on image 14/11. No evidence of pulmonary infiltrate or pleural effusion. The visualized portions of the mediastinum and chest  wall are unremarkable. Prior Nissen fundoplication incidentally noted. : 4 mm indeterminate right middle lobe pulmonary nodule. No follow-up needed if patient is low-risk.This recommendation follows the consensus statement: Guidelines for Management of Incidental Pulmonary Nodules Detected on CT Images: From the Fleischner Society 2017; Radiology 2017; 284:228-243. Electronically Signed   By: Marlaine Hind M.D.   On: 04/28/2022 12:20   Result Date: 04/28/2022 CLINICAL DATA:  81 year old female with chest pain EXAM: Cardiac/Coronary  CTA TECHNIQUE: The patient was scanned on a [employer]. FINDINGS: A 120 kV prospective scan was triggered in the descending thoracic aorta at 111 HU's. Axial non-contrast 3 mm slices were carried out through the heart. The data set was analyzed on a dedicated work station and scored using the [place]. Gantry rotation speed was 250 msecs and collimation was .6 mm. 0.8 mg of sl NTG was given. The 3D data set was reconstructed in 5% intervals of the 67-82 % of the R-R cycle. Diastolic phases were analyzed on a dedicated work station using MPR, MIP and VRT modes. The patient received 80 cc of contrast. Image quality: good Aorta: Normal size. Moderate atherosclerosis ascending aorta. No dissection. Aortic Valve: Appears [place].  No calcifications. Coronary Arteries:  Normal coronary origin.  Left  dominance. RCA is a small non dominant artery. There is 50-69% moderate stenosis, soft plaque, at the ostium. Vessel is small, not amenable to PCI Left main is a large artery that gives rise to LAD and LCX arteries. LAD is a large vessel that has one large branching diagonal with mild 30-49% stenosis proximally, soft plaque. There is proximal LAD calcified plaque 0-24%. LCX is a dominant artery that gives rise to one large OM1 branch and PDA. There is no plaque. Other findings: Normal pulmonary vein drainage into the left atrium. Normal left atrial appendage without a  thrombus. Normal size of the pulmonary artery. There is a moderate sized hiatal hernia. Please see radiology report for non cardiac findings. IMPRESSION: 1. Coronary calcium score of 5 (LAD). This was 69 percentile for age and sex matched control. 2. Normal coronary origin with LEFT dominance. 3. RCA is a small non dominant artery. There is 50-69% moderate stenosis, soft plaque, at the ostium. Vessel is small, not amenable to PCI. 4. LAD is a large vessel that has one large branching diagonal with MILD 30-49% stenosis proximally, soft plaque. There is proximal LAD calcified plaque 0-24%. 5.  Hiatal hernia. CAD-RADS 3. Moderate stenosis. Consider symptom-guided anti-ischemic pharmacotherapy as well as risk factor modification per guideline directed care. Electronically Signed: By: Candee Furbish M.D. On: 04/28/2022 10:55   ECHOCARDIOGRAM COMPLETE  Result Date: 04/27/2022    ECHOCARDIOGRAM REPORT   Patient Name:   [facility] Date of Exam: 04/27/2022 Medical Rec #:  [id number]           Height:       60.0 in Accession #:    [id number]          Weight:  126.0 lb Date of Birth:  Apr 28, 1941           BSA:          1.534 m Patient Age:    25 years            BP:           131/63 mmHg Patient Gender: F                   HR:           52 bpm. Exam Location:  Inpatient Procedure: 2D Echo, Color Doppler and Cardiac Doppler Indications:    R94.31 Abnormal EKG  History:        Patient has no prior history of Echocardiogram examinations.                 Risk Factors:Hypertension and Dyslipidemia.  Sonographer:    Raquel Sarna Senior RDCS Referring Phys: [id number] [place]  1. Left ventricular ejection fraction, by estimation, is 60 to 65%. The left ventricle has normal function. The left ventricle has no regional wall motion abnormalities. Left ventricular diastolic parameters are consistent with Grade I diastolic dysfunction (impaired relaxation).  2. Right ventricular systolic function is normal. The right  ventricular size is mildly enlarged. There is mildly elevated pulmonary artery systolic pressure.  3. Left atrial size was mildly dilated.  4. The mitral valve is normal in structure. Trivial mitral valve regurgitation. No evidence of mitral stenosis.  5. The aortic valve is normal in structure. Aortic valve regurgitation is mild to moderate. No aortic stenosis is present.  6. The inferior vena cava is normal in size with greater than 50% respiratory variability, suggesting right atrial pressure of 3 mmHg. FINDINGS  Left Ventricle: Left ventricular ejection fraction, by estimation, is 60 to 65%. The left ventricle has normal function. The left ventricle has no regional wall motion abnormalities. The left ventricular internal cavity size was normal in size. There is  no left ventricular hypertrophy. Left ventricular diastolic parameters are consistent with Grade I diastolic dysfunction (impaired relaxation). Right Ventricle: The right ventricular size is mildly enlarged. No increase in right ventricular wall thickness. Right ventricular systolic function is normal. There is mildly elevated pulmonary artery systolic pressure. The tricuspid regurgitant velocity is 2.90 m/s, and with an assumed right atrial pressure of 3 mmHg, the estimated right ventricular systolic pressure is 45.4 mmHg. Left Atrium: Left atrial size was mildly dilated. Right Atrium: Right atrial size was normal in size. Pericardium: There is no evidence of pericardial effusion. Mitral Valve: The mitral valve is normal in structure. Trivial mitral valve regurgitation. No evidence of mitral valve stenosis. Tricuspid Valve: The tricuspid valve is normal in structure. Tricuspid valve regurgitation is mild . No evidence of tricuspid stenosis. Aortic Valve: The aortic valve is normal in structure. Aortic valve regurgitation is mild to moderate. Aortic regurgitation PHT measures 584 msec. No aortic stenosis is present. Pulmonic Valve: The pulmonic valve was  normal in structure. Pulmonic valve regurgitation is not visualized. No evidence of pulmonic stenosis. Aorta: The aortic root is normal in size and structure. Venous: The inferior vena cava is normal in size with greater than 50% respiratory variability, suggesting right atrial pressure of 3 mmHg. IAS/Shunts: No atrial level shunt detected by color flow Doppler.  LEFT VENTRICLE PLAX 2D LVIDd:         5.10 cm   Diastology LVIDs:         3.40 cm   LV  e' medial:    6.22 cm/s LV PW:         0.90 cm   LV E/e' medial:  9.4 LV IVS:        0.80 cm   LV e' lateral:   12.60 cm/s LVOT diam:     1.90 cm   LV E/e' lateral: 4.6 LV SV:         72 LV SV Index:   47 LVOT Area:     2.84 cm  RIGHT VENTRICLE RV S prime:     14.70 cm/s TAPSE (M-mode): 2.6 cm LEFT ATRIUM             Index        RIGHT ATRIUM           Index LA diam:        3.40 cm 2.22 cm/m   RA Area:     13.90 cm LA Vol (A2C):   62.7 ml 40.88 ml/m  RA Volume:   35.40 ml  23.08 ml/m LA Vol (A4C):   51.2 ml 33.38 ml/m LA Biplane Vol: 59.2 ml 38.60 ml/m  AORTIC VALVE LVOT Vmax:   94.50 cm/s LVOT Vmean:  66.900 cm/s LVOT VTI:    0.254 m AI PHT:      584 msec  AORTA Ao Root diam: 2.90 cm Ao Asc diam:  3.20 cm MITRAL VALVE               TRICUSPID VALVE MV Area (PHT): 3.39 cm    TR Peak grad:   33.6 mmHg MV Decel Time: 224 msec    TR Vmax:        290.00 cm/s MV E velocity: 58.20 cm/s MV A velocity: 65.00 cm/s  SHUNTS MV E/A ratio:  0.90        Systemic VTI:  0.25 m                            Systemic Diam: 1.90 cm Kardie Tobb DO Electronically signed by Berniece Salines DO Signature Date/Time: 04/27/2022/12:20:01 PM    Final    DG Chest 2 View  Result Date: 04/26/2022 CLINICAL DATA:  Chest pain. EXAM: CHEST - 2 VIEW COMPARISON:  October 02, 2009. FINDINGS: The heart size and mediastinal contours are within normal limits. Both lungs are clear. The visualized skeletal structures are unremarkable. IMPRESSION: No active cardiopulmonary disease. Electronically Signed   By:  Marijo Conception M.D.   On: 04/26/2022 08:26   (Echo, Carotid, EGD, Colonoscopy, ERCP)    Subjective: No complaints  Discharge Exam: Vitals:   04/28/22 0902 04/28/22 1205  BP:  (!) 106/56  Pulse: 68 (!) 54  Resp:  20  Temp:  (!) 97.4 F (36.3 C)  SpO2:  95%   Vitals:   04/28/22 0638 04/28/22 0746 04/28/22 0902 04/28/22 1205  BP: (!) 125/57 120/61  (!) 106/56  Pulse: 68 60 68 (!) 54  Resp: '18 16  20  '$ Temp: (!) 97.5 F (36.4 C) 97.7 F (36.5 C)  (!) 97.4 F (36.3 C)  TempSrc: Oral Oral  Oral  SpO2: 91% 97%  95%  Weight:      Height:        General: Pt is alert, awake, not in acute distress Cardiovascular: RRR, S1/S2 +, no rubs, no gallops Respiratory: CTA bilaterally, no wheezing, no rhonchi Abdominal: Soft, NT, ND, bowel sounds + Extremities: no edema, no cyanosis  The results of significant diagnostics from this hospitalization (including imaging, microbiology, ancillary and laboratory) are listed below for reference.     Microbiology: No results found for this or any previous visit (from the past 240 hour(s)).   Labs: BNP (last 3 results) No results for input(s): "BNP" in the last 8760 hours. Basic Metabolic Panel: Recent Labs  Lab 04/26/22 0754 04/26/22 1852 04/27/22 0257 04/28/22 0323  NA 134*  --  131* 133*  K 3.8  --  3.3* 4.2  CL 93*  --  95* 101  CO2 29  --  28 26  GLUCOSE 118*  --  112* 103*  BUN 18  --  17 17  CREATININE 0.77 1.20* 0.87 0.76  CALCIUM 10.1  --  8.6* 8.7*   Liver Function Tests: Recent Labs  Lab 04/26/22 0754  AST 25  ALT 25  ALKPHOS 67  BILITOT 0.8  PROT 6.9  ALBUMIN 4.4   Recent Labs  Lab 04/26/22 0754  LIPASE 23   No results for input(s): "AMMONIA" in the last 168 hours. CBC: Recent Labs  Lab 04/26/22 0754 04/26/22 1852 04/27/22 0257  WBC 9.2 12.1* 8.9  HGB 13.7 13.4 12.9  HCT 39.9 39.0 36.4  MCV 90.9 91.3 90.8  PLT 233 239 220   Cardiac Enzymes: No results for input(s): "CKTOTAL", "CKMB",  "CKMBINDEX", "TROPONINI" in the last 168 hours. BNP: Invalid input(s): "POCBNP" CBG: Recent Labs  Lab 04/27/22 0935 04/28/22 0743  GLUCAP 111* 111*   D-Dimer No results for input(s): "DDIMER" in the last 72 hours. Hgb A1c Recent Labs    04/27/22 0257  HGBA1C 5.3   Lipid Profile Recent Labs    04/27/22 0257  CHOL 157  HDL 51  LDLCALC 89  TRIG 85  CHOLHDL 3.1   Thyroid function studies No results for input(s): "TSH", "T4TOTAL", "T3FREE", "THYROIDAB" in the last 72 hours.  Invalid input(s): "FREET3" Anemia work up No results for input(s): "VITAMINB12", "FOLATE", "FERRITIN", "TIBC", "IRON", "RETICCTPCT" in the last 72 hours. Urinalysis No results found for: "COLORURINE", "APPEARANCEUR", "LABSPEC", "PHURINE", "GLUCOSEU", "HGBUR", "BILIRUBINUR", "KETONESUR", "PROTEINUR", "UROBILINOGEN", "NITRITE", "LEUKOCYTESUR" Sepsis Labs Recent Labs  Lab 04/26/22 0754 04/26/22 1852 04/27/22 0257  WBC 9.2 12.1* 8.9   Microbiology No results found for this or any previous visit (from the past 240 hour(s)).  SIGNED:   Charlynne Cousins, MD  [facility] 04/28/2022, 1:08 PM Pager   If 7PM-7AM, please contact night-coverage [email or web address] Password [email or web address]

## 2022-04-29 ENCOUNTER — Other Ambulatory Visit (HOSPITAL_COMMUNITY): Payer: Self-pay

## 2022-05-06 DIAGNOSIS — I10 Essential (primary) hypertension: Secondary | ICD-10-CM | POA: Diagnosis not present

## 2022-05-06 DIAGNOSIS — R079 Chest pain, unspecified: Secondary | ICD-10-CM | POA: Diagnosis not present

## 2022-05-06 DIAGNOSIS — E782 Mixed hyperlipidemia: Secondary | ICD-10-CM | POA: Diagnosis not present

## 2022-05-06 DIAGNOSIS — K219 Gastro-esophageal reflux disease without esophagitis: Secondary | ICD-10-CM | POA: Diagnosis not present

## 2022-05-09 ENCOUNTER — Encounter: Payer: Self-pay | Admitting: Nurse Practitioner

## 2022-05-09 ENCOUNTER — Ambulatory Visit: Payer: Medicare Other | Admitting: Nurse Practitioner

## 2022-05-09 VITALS — BP 138/64 | HR 54 | Ht 60.0 in | Wt 129.6 lb

## 2022-05-09 DIAGNOSIS — I2510 Atherosclerotic heart disease of native coronary artery without angina pectoris: Secondary | ICD-10-CM | POA: Diagnosis not present

## 2022-05-09 DIAGNOSIS — K219 Gastro-esophageal reflux disease without esophagitis: Secondary | ICD-10-CM

## 2022-05-09 DIAGNOSIS — Z79899 Other long term (current) drug therapy: Secondary | ICD-10-CM

## 2022-05-09 DIAGNOSIS — I10 Essential (primary) hypertension: Secondary | ICD-10-CM | POA: Diagnosis not present

## 2022-05-09 DIAGNOSIS — E785 Hyperlipidemia, unspecified: Secondary | ICD-10-CM | POA: Diagnosis not present

## 2022-05-09 DIAGNOSIS — R079 Chest pain, unspecified: Secondary | ICD-10-CM | POA: Diagnosis not present

## 2022-05-09 NOTE — Progress Notes (Signed)
Office Visit    Patient Name: Katelyn Moore Date of Encounter: 05/09/2022  Primary Care Provider:  Mckinley Jewel, MD Primary Cardiologist:  Quay Burow, MD  Chief Complaint    81 year old female with a history of CAD, hypertension, GERD, migraine, and melanoma who presents for hospital follow-up related to CAD and chest pain.  Past Medical History    Past Medical History:  Diagnosis Date   Cancer ([place])    Skin cancer - basal cell removed   Headache(784.0)    Hypertension    Melanoma ([place])    Past Surgical History:  Procedure Laterality Date   ABDOMINAL HYSTERECTOMY     BREAST LUMPECTOMY WITH RADIOACTIVE SEED LOCALIZATION Left 11/24/2014   Procedure: BREAST LUMPECTOMY WITH RADIOACTIVE SEED LOCALIZATION;  Surgeon: Fanny Skates, MD;  Location: [facility];  Service: General;  Laterality: Left;   [place] Fundal plication     TONSILLECTOMY     TUBAL LIGATION      Allergies  Allergies  Allergen Reactions   Codeine Other (See Comments)    headaches   Tape Other (See Comments)    Blisters from tape from  Breast biopsy   Morphine And Related Rash    History of Present Illness    81 year old female with the above past medical history including CAD, hypertension, GERD, migraine, and melanoma.  She presents to the ED on 04/26/2022 with complaints of substernal chest pain that radiated to her jaw.  EKG showed no evidence of ischemia, troponin was negative x2.  She was hospitazlied from 04/26/2022 to 04/28/2022.  Cardiology was consulted. Echocardiogram revealed EF 60 to 65%, normal LV function, no RWMA, G1 DD, normal RV systolic function, mildly elevated PASP, mildly dilated left atrium, mild to moderate aortic valve regurgitation. Coronary CTA revealed calcium score of 5, 15th percentile for age and sex matched control, mild to moderate CAD.  Continued statin therapy was recommended as well as follow-up with PCP.  She was started on Protonix for suspected  GERD.  Prescription home in stable condition on 04/28/2022.  She presents today for follow-up.  Since her recent ED visit she has done well from a cardiac standpoint. She denies any symptoms concerning for angina, denies chest pain, dyspnea. She is exercising regularly and seems to be tolerating this well. Overall, she reports feeling well and denies any new concerns today.   Home Medications    Current Outpatient Medications  Medication Sig Dispense Refill   aspirin EC 81 MG tablet Take 81 mg by mouth daily.     atenolol (TENORMIN) 50 MG tablet Take 50 mg by mouth daily.     Calcium Carb-Cholecalciferol (CALCIUM + D3 PO) Take 333 mg by mouth 3 (three) times daily before meals.     chlorthalidone (HYGROTON) 25 MG tablet Take 25 mg by mouth daily.     magnesium 30 MG tablet Take 30 mg by mouth 2 (two) times daily.     Omega-3 Fatty Acids (FISH OIL PO) Take 1,000 mg by mouth once.     omeprazole (PRILOSEC) 20 MG capsule Take 1 capsule (20 mg total) by mouth 2 (two) times daily before a meal. 60 capsule 0   rizatriptan (MAXALT) 10 MG tablet Take 1 tablet (10 mg total) by mouth as needed for migraine. May repeat in 2 hours if needed 12 tablet 6   rosuvastatin (CRESTOR) 20 MG tablet Take 1 tablet (20 mg total) by mouth daily. 30 tablet 3   TURMERIC PO Take by  mouth.     UBRELVY 100 MG TABS TAKE 1 TABLET BY MOUTH AS NEEDED FOR HEADACHE 10 tablet 6   vitamin B-12 (CYANOCOBALAMIN) 100 MCG tablet Take 100 mcg by mouth daily.     No current facility-administered medications for this visit.     Review of Systems    She denies chest pain, palpitations, dyspnea, pnd, orthopnea, n, v, dizziness, syncope, edema, weight gain, or early satiety. All other systems reviewed and are otherwise negative except as noted above.   Physical Exam    VS:  BP 138/64   Pulse (!) 54   Ht 5' (1.524 m)   Wt 129 lb 9.6 oz (58.8 kg)   SpO2 98%   BMI 25.31 kg/m   GEN: Well nourished, well developed, in no acute  distress. HEENT: normal. Neck: Supple, no JVD, carotid bruits, or masses. Cardiac: RRR, no murmurs, rubs, or gallops. No clubbing, cyanosis, edema.  Radials/DP/PT 2+ and equal bilaterally.  Respiratory:  Respirations regular and unlabored, clear to auscultation bilaterally. GI: Soft, nontender, nondistended, BS + x 4. MS: no deformity or atrophy. Skin: warm and dry, no rash. Neuro:  Strength and sensation are intact. Psych: Normal affect.  Accessory Clinical Findings    ECG personally reviewed by me today - Sinus bradycardia, 54 bpm - no acute changes.   Lab Results  Component Value Date   WBC 8.9 04/27/2022   HGB 12.9 04/27/2022   HCT 36.4 04/27/2022   MCV 90.8 04/27/2022   PLT 220 04/27/2022   Lab Results  Component Value Date   CREATININE 0.76 04/28/2022   BUN 17 04/28/2022   NA 133 (L) 04/28/2022   K 4.2 04/28/2022   CL 101 04/28/2022   CO2 26 04/28/2022   Lab Results  Component Value Date   ALT 25 04/26/2022   AST 25 04/26/2022   ALKPHOS 67 04/26/2022   BILITOT 0.8 04/26/2022   Lab Results  Component Value Date   CHOL 157 04/27/2022   HDL 51 04/27/2022   LDLCALC 89 04/27/2022   TRIG 85 04/27/2022   CHOLHDL 3.1 04/27/2022    Lab Results  Component Value Date   HGBA1C 5.3 04/27/2022    Assessment & Plan    1. CAD/chest pain: Coronary CTA revealed calcium score of 5, 15th percentile for age and sex matched control, mild to moderate CAD. Stable with no anginal symptoms. No indication for ischemic evaluation. She remains active, exercises regularly.  She thinks her chest discomfort was likely related to GERD as her symptoms have improved with the addition of Protonix. Continue aspirin, Crestor.  2. Hypertension: BP well controlled. Continue current antihypertensive regimen.   3. Hyperlipidemia: Recently started on Crestor. Will repeat fasting lipids, LFTs in 6-8 weeks. Continue ASA, Crestor.   4. GERD: Continue pantoprazole.    5.Disposition: Follow-up in  6 months with Dr. Gwenlyn Found.      Lenna Sciara, NP 05/09/2022, 4:29 PM

## 2022-05-09 NOTE — Patient Instructions (Signed)
Medication Instructions:  Your physician recommends that you continue on your current medications as directed. Please refer to the Current Medication list given to you today.   *If you need a refill on your cardiac medications before your next appointment, please call your pharmacy*   Lab Work: Your physician recommends that you return for lab work in 6-8 weeks. Fasting Lipid panel LFTs  If you have labs (blood work) drawn today and your tests are completely normal, you will receive your results only by: MyChart Message (if you have MyChart) OR A paper copy in the mail If you have any lab test that is abnormal or we need to change your treatment, we will call you to review the results.   Testing/Procedures: NONE ordered at this time of appointment    Follow-Up: At [facility], you and your health needs are our priority.  As part of our continuing mission to provide you with exceptional heart care, we have created designated Provider Care Teams.  These Care Teams include your primary Cardiologist (physician) and Advanced Practice Providers (APPs -  Physician Assistants and Nurse Practitioners) who all work together to provide you with the care you need, when you need it.  We recommend signing up for the patient portal called "MyChart".  Sign up information is provided on this After Visit Summary.  MyChart is used to connect with patients for Virtual Visits (Telemedicine).  Patients are able to view lab/test results, encounter notes, upcoming appointments, etc.  Non-urgent messages can be sent to your provider as well.   To learn more about what you can do with MyChart, go to [email or web address].    Your next appointment:   6 month(s)  The format for your next appointment:   In Person  Provider:   Quay Burow, MD     Other Instructions   Important Information About Sugar

## 2022-06-17 ENCOUNTER — Emergency Department (HOSPITAL_COMMUNITY)
Admission: EM | Admit: 2022-06-17 | Discharge: 2022-06-18 | Disposition: A | Discharge status: Home / Self Care | Payer: Medicare Other | Attending: Emergency Medicine | Admitting: Emergency Medicine

## 2022-06-17 ENCOUNTER — Emergency Department (HOSPITAL_COMMUNITY): Payer: Medicare Other

## 2022-06-17 ENCOUNTER — Telehealth: Payer: Self-pay | Admitting: Physician Assistant

## 2022-06-17 DIAGNOSIS — Z853 Personal history of malignant neoplasm of breast: Secondary | ICD-10-CM | POA: Insufficient documentation

## 2022-06-17 DIAGNOSIS — E876 Hypokalemia: Secondary | ICD-10-CM | POA: Insufficient documentation

## 2022-06-17 DIAGNOSIS — I480 Paroxysmal atrial fibrillation: Secondary | ICD-10-CM | POA: Diagnosis not present

## 2022-06-17 DIAGNOSIS — R42 Dizziness and giddiness: Secondary | ICD-10-CM | POA: Diagnosis not present

## 2022-06-17 DIAGNOSIS — Z79899 Other long term (current) drug therapy: Secondary | ICD-10-CM | POA: Insufficient documentation

## 2022-06-17 DIAGNOSIS — I10 Essential (primary) hypertension: Secondary | ICD-10-CM | POA: Diagnosis not present

## 2022-06-17 DIAGNOSIS — Z7901 Long term (current) use of anticoagulants: Secondary | ICD-10-CM | POA: Diagnosis not present

## 2022-06-17 DIAGNOSIS — I4891 Unspecified atrial fibrillation: Secondary | ICD-10-CM | POA: Diagnosis not present

## 2022-06-17 DIAGNOSIS — R002 Palpitations: Secondary | ICD-10-CM | POA: Diagnosis not present

## 2022-06-17 DIAGNOSIS — I499 Cardiac arrhythmia, unspecified: Secondary | ICD-10-CM | POA: Diagnosis not present

## 2022-06-17 LAB — TROPONIN I (HIGH SENSITIVITY)
Troponin I (High Sensitivity): 16 ng/L (ref <18)
Troponin I (High Sensitivity): 22 ng/L — ABNORMAL HIGH (ref <18)

## 2022-06-17 LAB — COMPREHENSIVE METABOLIC PANEL
ALT: 22 U/L (ref 0–44)
AST: 33 U/L (ref 15–41)
Albumin: 3.9 g/dL (ref 3.5–5.0)
Alkaline Phosphatase: 59 U/L (ref 38–126)
Anion gap: 11 (ref 5–15)
BUN: 18 mg/dL (ref 8–23)
CO2: 25 mmol/L (ref 22–32)
Calcium: 9.3 mg/dL (ref 8.9–10.3)
Chloride: 98 mmol/L (ref 98–111)
Creatinine, Ser: 0.74 mg/dL (ref 0.44–1.00)
GFR, Estimated: >60 mL/min (ref >60)
Glucose, Bld: 154 mg/dL — ABNORMAL HIGH (ref 70–99)
Potassium: 3.4 mmol/L — ABNORMAL LOW (ref 3.5–5.1)
Sodium: 134 mmol/L — ABNORMAL LOW (ref 135–145)
Total Bilirubin: 0.7 mg/dL (ref 0.3–1.2)
Total Protein: 6.3 g/dL — ABNORMAL LOW (ref 6.5–8.1)

## 2022-06-17 LAB — CBC
HCT: 42.2 % (ref 36.0–46.0)
Hemoglobin: 14.1 g/dL (ref 12.0–15.0)
MCH: 31.1 pg (ref 26.0–34.0)
MCHC: 33.4 g/dL (ref 30.0–36.0)
MCV: 93 fL (ref 80.0–100.0)
Platelets: 266 10*3/uL (ref 150–400)
RBC: 4.54 MIL/uL (ref 3.87–5.11)
RDW: 11.8 % (ref 11.5–15.5)
WBC: 8.7 10*3/uL (ref 4.0–10.5)
nRBC: 0 % (ref 0.0–0.2)

## 2022-06-17 LAB — TSH: TSH: 0.97 u[IU]/mL (ref 0.350–4.500)

## 2022-06-17 LAB — MAGNESIUM: Magnesium: 1.8 mg/dL (ref 1.7–2.4)

## 2022-06-17 MED ORDER — ATENOLOL 25 MG PO TABS
50.0000 mg | ORAL_TABLET | Freq: Once | ORAL | Status: AC
  Administered 2022-06-17: 50 mg via ORAL
  Filled 2022-06-17: qty 2

## 2022-06-17 MED ORDER — APIXABAN 2.5 MG PO TABS: 2.5000 mg | ORAL_TABLET | Freq: Two times a day (BID) | ORAL | 0 refills | Status: DC

## 2022-06-17 MED ORDER — APIXABAN 2.5 MG PO TABS
2.5000 mg | ORAL_TABLET | Freq: Two times a day (BID) | ORAL | Status: DC
  Administered 2022-06-17: 2.5 mg via ORAL
  Filled 2022-06-17: qty 1

## 2022-06-17 MED ORDER — ATENOLOL 50 MG PO TABS: 50.0000 mg | ORAL_TABLET | Freq: Every day | ORAL | 0 refills | Status: DC

## 2022-06-17 MED ORDER — NURTEC 75 MG PO TBDP
75.0000 mg | ORAL_TABLET | ORAL | 11 refills | Status: AC | PRN
Start: 2022-06-17 — End: ?

## 2022-06-17 MED ORDER — POTASSIUM CHLORIDE CRYS ER 20 MEQ PO TBCR
40.0000 meq | EXTENDED_RELEASE_TABLET | Freq: Once | ORAL | Status: AC
  Administered 2022-06-17: 40 meq via ORAL
  Filled 2022-06-17: qty 2

## 2022-06-17 NOTE — ED Provider Notes (Signed)
[facility] EMERGENCY DEPARTMENT Provider Note   CSN: [id number] Arrival date & time: 06/17/22  1717     History  No chief complaint on file.   Katelyn Moore is a 81 y.o. female.  HPI Patient is an 81 year old female with past medical history significant for hypertension, breast cancer status postlumpectomy, headaches, HLD, prediabetes  Before arrival in the emergency room patient was in A-fib RVR with heart rates elevated to 170/180.  Was given 10 mg of Cardizem by EMS and converted to normal sinus rhythm prior to arrival.  Asymptomatic currently  Pt states she's had episodes of palpitations since 9/24 intermittently. States Monday episode was severe and made her feel very weak. Palpitations would improve with laying down. Mostly in afternoons at least once a day.  She has no history of atrial fibrillation.  No etoh, no RDU, neg covid test at home.  No chest pain or difficulty breathing. When she is not having episodes she states that she has no lightheadedness or dizziness and feels generally well.  Off atenolol 1 week before sx which was discontinued by cardiology due to low heart rates.        Home Medications Prior to Admission medications   Medication Sig Start Date End Date Taking? Authorizing Provider  apixaban (ELIQUIS) 2.5 MG TABS tablet Take 1 tablet (2.5 mg total) by mouth 2 (two) times daily. 06/22/22   Lorretta Harp, MD  atenolol (TENORMIN) 50 MG tablet Take 1 tablet (50 mg total) by mouth daily. 06/22/22   Lorretta Harp, MD  Calcium Carb-Cholecalciferol (CALCIUM + D3 PO) Take 333 mg by mouth 3 (three) times daily before meals.    [provider]  chlorthalidone (HYGROTON) 25 MG tablet Take 25 mg by mouth daily.    [provider]  magnesium 30 MG tablet Take 30 mg by mouth 2 (two) times daily.    [provider]  Omega-3 Fatty Acids (FISH OIL PO) Take 1,000 mg by mouth once.    [provider]   omeprazole (PRILOSEC) 20 MG capsule Take 1 capsule (20 mg total) by mouth 2 (two) times daily before a meal. 04/28/22   Charlynne Cousins, MD  Rimegepant Sulfate (NURTEC) 75 MG TBDP Take 75 mg by mouth as needed. 06/17/22   Jaclyn Prime, Collene Leyden, PA-C  rosuvastatin (CRESTOR) 20 MG tablet Take 1 tablet (20 mg total) by mouth daily. 04/28/22   Charlynne Cousins, MD  TURMERIC PO Take by mouth.    [provider]  vitamin B-12 (CYANOCOBALAMIN) 100 MCG tablet Take 100 mcg by mouth daily.    [provider]      Allergies    Codeine, Tape, and Morphine and related    Review of Systems   Review of Systems  Physical Exam Updated Vital Signs BP 107/78   Pulse (!) 56   Temp 97.9 F (36.6 C) (Oral)   Resp 20   SpO2 96%  Physical Exam Vitals and nursing note reviewed.  Constitutional:      General: She is not in acute distress.    Comments: Pleasant well-appearing 81 year old.  In no acute distress.  Sitting comfortably in bed.  Able answer questions appropriately follow commands. No increased work of breathing. Speaking in full sentences.   HENT:     Head: Normocephalic and atraumatic.     Nose: Nose normal.     Mouth/Throat:     Mouth: Mucous membranes are moist.  Eyes:  General: No scleral icterus. Cardiovascular:     Rate and Rhythm: Normal rate and regular rhythm.     Pulses: Normal pulses.     Heart sounds: Normal heart sounds.  Pulmonary:     Effort: Pulmonary effort is normal. No respiratory distress.     Breath sounds: No wheezing.  Abdominal:     General: Abdomen is flat.  Musculoskeletal:     Cervical back: Normal range of motion.     Right lower leg: No edema.     Left lower leg: No edema.  Skin:    General: Skin is warm and dry.     Capillary Refill: Capillary refill takes less than 2 seconds.  Neurological:     Mental Status: She is alert. Mental status is at baseline.  Psychiatric:        Mood and Affect: Mood normal.        Behavior:  Behavior normal.     ED Results / Procedures / Treatments   Labs (all labs ordered are listed, but only abnormal results are displayed) Labs Reviewed  COMPREHENSIVE METABOLIC PANEL - Abnormal; Notable for the following components:      Result Value   Sodium 134 (*)    Potassium 3.4 (*)    Glucose, Bld 154 (*)    Total Protein 6.3 (*)    All other components within normal limits  TROPONIN I (HIGH SENSITIVITY) - Abnormal; Notable for the following components:   Troponin I (High Sensitivity) 22 (*)    All other components within normal limits  TSH  CBC  MAGNESIUM  TROPONIN I (HIGH SENSITIVITY)    EKG EKG Interpretation  Date/Time:  Friday June 17 2022 20:29:32 EDT Ventricular Rate:  148 PR Interval:  124 QRS Duration: 81 QT Interval:  290 QTC Calculation: 455 R Axis:   12 Text Interpretation: sinus rhythm converting to Supraventricular tachycardia converting to atrial fibrillation Repolarization abnormality, prob rate related Confirmed by Blanchie Dessert [phone]) on 06/18/2022 8:06:40 PM  Radiology No results found.  Procedures Procedures    Medications Ordered in ED Medications  potassium chloride SA (KLOR-CON M) CR tablet 40 mEq (40 mEq Oral Given 06/17/22 2139)  atenolol (TENORMIN) tablet 50 mg (50 mg Oral Given 06/17/22 2139)    ED Course/ Medical Decision Making/ A&P Clinical Course as of 06/24/22 5638  Fri Jun 17, 2022  1733 Pt states she's had episodes of palpitations since 9/24 intermittently. States Monday episode was severe and made her feel very weak. Palpitations would improve with laying down. Mostly in afternoons at least once a day.   No etoh, no RDU, neg covid test, no hx of afib.    Off atenolol 1 week before sx.   [WF]  47 Was admitted 1 month ago for CP workup.  Cardiology was consulted CT angio of the coronaries was done that deemed her low risk [WF]  2113 Dr. Cathie Hoops --> atenolol 50 daily and reduced dose DOAC eliquis 2.5 BID. Cards FU  Dr. Cathie Hoops will ensure brisk FU.  [WF]    Clinical Course User Index [WF] Tedd Sias, PA                           Medical Decision Making Amount and/or Complexity of Data Reviewed Labs: ordered. Radiology: ordered.  Risk Prescription drug management.   This patient presents to the ED for concern of light headedness, this involves a number of treatment options, and is a  complaint that carries with it a moderate-to-high risk of complications and morbidity.  The differential diagnosis includes arrhythmia, e- abn, viral illness, MI   Co morbidities: Discussed in HPI   Brief History:  Patient is an 81 year old female with past medical history significant for hypertension, breast cancer status postlumpectomy, headaches, HLD, prediabetes  Before arrival in the emergency room patient was in A-fib RVR with heart rates elevated to 170/180.  Was given 10 mg of Cardizem by EMS and converted to normal sinus rhythm prior to arrival.  Asymptomatic currently  Pt states she's had episodes of palpitations since 9/24 intermittently. States Monday episode was severe and made her feel very weak. Palpitations would improve with laying down. Mostly in afternoons at least once a day.  She has no history of atrial fibrillation.  No etoh, no RDU, neg covid test at home.  No chest pain or difficulty breathing. When she is not having episodes she states that she has no lightheadedness or dizziness and feels generally well.  Off atenolol 1 week before sx which was discontinued by cardiology due to low heart rates.  Physical exam generally unremarkable.  EMR reviewed including pt PMHx, past surgical history and past visits to ER.   See HPI for more details   Lab Tests:   I ordered and independently interpreted labs. Labs notable for mild hypokalemia repleted here.  Magnesium normal, CBC unremarkable, TSH within normal limits troponin x2 16, 22, likely rate related.  Will discuss with  cardiology.   Imaging Studies:  NAD. I personally reviewed all imaging studies and no acute abnormality found. I agree with radiology interpretation.  No evidence of pulmonary edema or cardiac enlargement  Cardiac Monitoring:  The patient was maintained on a cardiac monitor.  I personally viewed and interpreted the cardiac monitored which showed an underlying rhythm of: NSR with occasional PAC.  Occasional episodes of increased rates but with visible P and T waves unlikely to be SVT heart rates approximately 1 50-1 80 during these episodes. EKG non-ischemic    Medicines ordered:  I ordered medication including Eliquis, metoprolol, potassium for anticoagulation, rate control, hypokalemia Reevaluation of the patient after these medicines showed that the patient stayed the same I have reviewed the patients home medicines and have made adjustments as needed   Critical Interventions:     Consults/Attending Physician   I requested consultation with Dr. Cathie Hoops,  and discussed lab and imaging findings as well as pertinent plan - they recommend: resulme atenolol but start at '50mg'$  daily (lower dose than previous '100mg'$ ) and DOAC at reduced dose given age/weight --> Dr. Cathie Hoops will ensure close cardiology follow up.  I discussed this case with my attending physician who cosigned this note including patient's presenting symptoms, physical exam, and planned diagnostics and interventions. Attending physician stated agreement with plan or made changes to plan which were implemented.    Reevaluation:  After the interventions noted above I re-evaluated patient and found that they have :improved   Social Determinants of Health:      Problem List / ED Course:  Paroxysmal A-fib.  Patient with elevated CHA2DS2-VASc score of 4 will require Eliquis.  Given age and weight will prescribe reduce dose Will require close cardiology follow-up.  Return precautions were  discussed   Dispostion:  After consideration of the diagnostic results and the patients response to treatment, I feel that the patent would benefit from close outpatient cardiology follow-up.   Final Clinical Impression(s) / ED Diagnoses Final diagnoses:  Paroxysmal  A-fib [facility])    Rx / DC Orders ED Discharge Orders          Ordered    apixaban (ELIQUIS) 2.5 MG TABS tablet  2 times daily,   Status:  Discontinued        06/17/22 2113    atenolol (TENORMIN) 50 MG tablet  Daily,   Status:  Discontinued        06/17/22 2113              Tedd Sias, PA 06/24/22 0726    Kemper Durie, DO 06/24/22 [phone]

## 2022-06-17 NOTE — Telephone Encounter (Signed)
Patient called indicating she is unable to get Ubrelvy filled.  Insurance is no longer covering this medication.  She previously had built up an excess and it has been some time since her last fill.  She notes it doesn't work as well as the rizatriptan but she is trying not to use rizatriptan since I recommended against it.  Her PCP is in agreed it could be unsafe for her to use in the setting of CAD.  In fact, she recently was admitted to the hospital for cardiac concern.  She now sees Dr. Gwenlyn Found, cardiologist.   Will send in [place] in place of Ubrelvy in hopes of improved insurance coverage.  She is to let us know if there are issues.   KTC

## 2022-06-17 NOTE — ED Notes (Signed)
Pt assisted to bedside commode by this RN. HR remained between 60-80 NSR. Wylder PA made aware

## 2022-06-17 NOTE — Progress Notes (Signed)
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 Per EMS note, she was in afib w/ RVR and received Cardizem x1 which converted her to NSR.  Per patient, she was formerly on atenolol 100 mg, but she reports this medication was stopped last month (cardiology note does not say the reason). On 09/24 she started to develop intermittent palpitations. In the ER, she had an EKG documented episode of SVT to 170 bpm with associated dizziness that self-terminated. Now vitals are stable. CBC/CMP without abnormality except K 3.4. TSH normal. No signs or symptoms of infection. ECHO on 04/27/22 showed normal LV and RV function with no regional wall motion abnormality. Coronary CTA showed non-obstructive coronary disease which is managed medically.  Given her hemodynamic stability, her restoration to NSR, and resolution of symptoms, it is reasonable to restart her home dose of atenolol for management of SVT (versus Afib? Strips from EMS are not visible). Will message her cardiologist for closer outpatient follow up to determine ideal rate vs rhythm control, and discussion of risks/benefits of anticoagulation and/or holter placement. Since strips from EMS are not visible, unclear if she had true afib episode.  Recommendations: - Restart home atenolol 25 mg qD (Dosing per Dr. Kennon Holter note) - Will message Dr. Gwenlyn Found for closer f/u given symptomatic atrial rhythm - Unclear if patient had true episode of atrial fibrillation in EMS or not. Can start low-dose apixaban 2.5 mg BID (reduced dose because age 8 and BMI <60 kg) and have her f/u with cardiology outpatient to discuss holter monitor placement and continuation of anticoagulation.  Loel Dubonnet, MD MPH [facility]

## 2022-06-17 NOTE — ED Notes (Signed)
Helped patient with assistance to bedside commode in her room. Initial HR was in the 80s. As she stood up, her heart rate increased to the 170s. Pt is sitting on bedside commode at this time. Josh, RN aware.

## 2022-06-17 NOTE — ED Notes (Signed)
This RN witnessed multiple episodes of SVT while pt was talking on the phone. Pts HR initially in the 80s and jumped to 130s-170s for approx 5 seconds and then returned to baseline. Pt denies SOB, dizziness, etc during the episodes. Wylder PA given EKG, cards to be consulted

## 2022-06-17 NOTE — Discharge Instructions (Addendum)
I am starting you back on the atenolol but at a lower dose.  You will need to follow-up with your cardiologist.  I have also started you on a blood thinner as atrial fibrillation can increase your risk of a stroke.  Please return to the emergency room for any new or concerning symptoms.  Otherwise please take medications as prescribed and follow-up with your cardiologist.  ______________________________________________________________________________________________________________________________ Information on my medicine - ELIQUIS (apixaban)  This medication education was reviewed with me or my healthcare representative as part of my discharge preparation.   Why was Eliquis prescribed for you? Eliquis was prescribed for you to reduce the risk of a blood clot forming that can cause a stroke if you have a medical condition called atrial fibrillation (a type of irregular heartbeat).  What do You need to know about Eliquis ? Take your Eliquis TWICE DAILY - one tablet in the morning and one tablet in the evening with or without food. If you have difficulty swallowing the tablet whole please discuss with your pharmacist how to take the medication safely.  Take Eliquis exactly as prescribed by your doctor and DO NOT stop taking Eliquis without talking to the doctor who prescribed the medication.  Stopping may increase your risk of developing a stroke.  Refill your prescription before you run out.  After discharge, you should have regular check-up appointments with your healthcare provider that is prescribing your Eliquis.  In the future your dose may need to be changed if your kidney function or weight changes by a significant amount or as you get older.  What do you do if you miss a dose? If you miss a dose, take it as soon as you remember on the same day and resume taking twice daily.  Do not take more than one dose of ELIQUIS at the same time to make up a missed dose.  Important Safety  Information A possible side effect of Eliquis is bleeding. You should call your healthcare provider right away if you experience any of the following: Bleeding from an injury or your nose that does not stop. Unusual colored urine (red or dark brown) or unusual colored stools (red or black). Unusual bruising for unknown reasons. A serious fall or if you hit your head (even if there is no bleeding).  Some medicines may interact with Eliquis and might increase your risk of bleeding or clotting while on Eliquis. To help avoid this, consult your healthcare provider or pharmacist prior to using any new prescription or non-prescription medications, including herbals, vitamins, non-steroidal anti-inflammatory drugs (NSAIDs) and supplements.  This website has more information on Eliquis (apixaban): http://www.eliquis.com/eliquis/home

## 2022-06-17 NOTE — ED Triage Notes (Signed)
PT BIB [facility] for AFIB/RVR from an UC.  Pt has been experiencing dizzyness and palpitations siince Sunday 9/24.  PT was taken off her Atenolol in Aug at prev. Hospital stay d/t bradycardia.    EMS gave 10 mg Cardizem push at 1644 and pt converted to NS w/ PAC.

## 2022-06-17 NOTE — Progress Notes (Signed)
ANTICOAGULATION CONSULT NOTE - Initial Consult  Pharmacy Consult for Apixaban Indication: nonvalvular atrial fibrillation  Allergies  Allergen Reactions   Codeine Other (See Comments)    headaches   Tape Other (See Comments)    Blisters from tape from  Breast biopsy   Morphine And Related Rash    Patient Measurements:    Vital Signs: Temp: 97.9 F (36.6 C) (09/29 1937) Temp Source: Oral (09/29 1937) BP: 128/68 (09/29 2139) Pulse Rate: 92 (09/29 2143)  Labs: Recent Labs    06/17/22 1738 06/17/22 1933  HGB 14.1  --   HCT 42.2  --   PLT 266  --   CREATININE 0.74  --   TROPONINIHS 16 22*    CrCl cannot be calculated (Unknown ideal weight.).   Medical History: Past Medical History:  Diagnosis Date   Cancer ([place])    Skin cancer - basal cell removed   Headache(784.0)    Hypertension    Melanoma ([place])     Medications:  (Not in a hospital admission)   Assessment: 81 yo F presents to ED with dizziness and palpitations since 9/24. Per EMS, pt was in Afib w/ RVR en route and was given diltiazem IV x1 and she converted back to NSR. Cardiology was consulted and is unclear if patient had Afib vs. SVT. Pharmacy consulted to dose apixaban for nonvalvular AFib.   Goal of Therapy:  Monitor platelets by anticoagulation protocol: Yes   Plan:  Apixaban 2.'5mg'$  by mouth twice daily (dose reduced for age >/= 6 and weight < 60kg) Pharmacy to provide education prior to discharge F/u with cardiology outpatient to determine duration of therapy  Luisa Hart, PharmD, BCPS Clinical Pharmacist 06/17/2022 10:15 PM   Please refer to [facility] for pharmacy phone number

## 2022-06-18 NOTE — ED Notes (Signed)
RN reviewed discharge instructions with pt. Pt verbalized understanding and had no further questions. VSS upon discharge.  

## 2022-06-20 ENCOUNTER — Telehealth (HOSPITAL_COMMUNITY): Payer: Self-pay

## 2022-06-20 NOTE — Telephone Encounter (Signed)
Reached out to patient to schedule ED f/u. Patient stated she will reach out to her Cardiologist.

## 2022-06-22 ENCOUNTER — Encounter: Payer: Self-pay | Admitting: *Deleted

## 2022-06-22 ENCOUNTER — Ambulatory Visit: Payer: Medicare Other | Attending: Cardiovascular Disease | Admitting: Cardiovascular Disease

## 2022-06-22 ENCOUNTER — Telehealth: Payer: Self-pay

## 2022-06-22 ENCOUNTER — Encounter: Payer: Self-pay | Admitting: Cardiovascular Disease

## 2022-06-22 DIAGNOSIS — I351 Nonrheumatic aortic (valve) insufficiency: Secondary | ICD-10-CM | POA: Diagnosis not present

## 2022-06-22 DIAGNOSIS — E782 Mixed hyperlipidemia: Secondary | ICD-10-CM | POA: Diagnosis not present

## 2022-06-22 DIAGNOSIS — I10 Essential (primary) hypertension: Secondary | ICD-10-CM | POA: Diagnosis not present

## 2022-06-22 DIAGNOSIS — I480 Paroxysmal atrial fibrillation: Secondary | ICD-10-CM | POA: Insufficient documentation

## 2022-06-22 DIAGNOSIS — R079 Chest pain, unspecified: Secondary | ICD-10-CM | POA: Diagnosis not present

## 2022-06-22 MED ORDER — ATENOLOL 50 MG PO TABS: 50.0000 mg | ORAL_TABLET | Freq: Every day | ORAL | 3 refills | Status: DC

## 2022-06-22 MED ORDER — APIXABAN 2.5 MG PO TABS: 2.5000 mg | ORAL_TABLET | Freq: Two times a day (BID) | ORAL | 1 refills | Status: DC

## 2022-06-22 NOTE — Assessment & Plan Note (Signed)
History of essential hypertension on atenolol and chlorthalidone with blood pressure measured today at 126/66.

## 2022-06-22 NOTE — Progress Notes (Signed)
06/22/2022 Katelyn Moore   02-22-1941  [id number]  Primary Physician Pahwani, Michell Heinrich, MD Primary Cardiologist: Lorretta Harp MD Katelyn Moore, [place]  HPI:  Katelyn Moore is a 81 y.o. thin-appearing married Caucasian female mother of 2 children, grandmother of 1 grandchild who is a retired Automotive engineer.  I initially saw her during her hospitalization 04/27/2022 for atypical chest pain.  She ruled out for myocardial infarction.  She had a coronary CTA that showed a coronary calcium score of 5 with moderate disease in a nondominant RCA but otherwise no obstructive disease.  2D echo showed normal LV function with mild to moderate AI.  She does have a history of hypertension on chlorthalidone and atenolol.  She was started on rosuvastatin for mildly elevated LDL given her CAD.  Her PCP weaned her off her beta-blocker and she was seen on 06/17/2022 with tachypalpitations in the ER with runs of A-fib with RVR.  She was placed back on a beta-blocker and has had no further episodes.  As result, she was begun on Eliquis oral anticoagulation.   No outpatient medications have been marked as taking for the 06/22/22 encounter (Office Visit) with Lorretta Harp, MD.     Allergies  Allergen Reactions   Codeine Other (See Comments)    headaches   Tape Other (See Comments)    Blisters from tape from  Breast biopsy   Morphine And Related Rash    Social History   Socioeconomic History   Marital status: Married    Spouse name: Not on file   Number of children: Not on file   Years of education: Not on file   Highest education level: Not on file  Occupational History   Not on file  Tobacco Use   Smoking status: Never   Smokeless tobacco: Never  Vaping Use   Vaping Use: Never used  Substance and Sexual Activity   Alcohol use: Yes    Comment: Drinks rarely probably  1 glass of wine a year   Drug use: No   Sexual activity: Not on file  Other Topics Concern   Not  on file  Social History Narrative   Not on file   Social Determinants of Health   Financial Resource Strain: Not on file  Food Insecurity: Not on file  Transportation Needs: Not on file  Physical Activity: Not on file  Stress: Not on file  Social Connections: Not on file  Intimate Partner Violence: Not on file     Review of Systems: General: negative for chills, fever, night sweats or weight changes.  Cardiovascular: negative for chest pain, dyspnea on exertion, edema, orthopnea, palpitations, paroxysmal nocturnal dyspnea or shortness of breath Dermatological: negative for rash Respiratory: negative for cough or wheezing Urologic: negative for hematuria Abdominal: negative for nausea, vomiting, diarrhea, bright red blood per rectum, melena, or hematemesis Neurologic: negative for visual changes, syncope, or dizziness All other systems reviewed and are otherwise negative except as noted above.    Blood pressure 126/66, pulse (!) 58, height 5' (1.524 m), weight 127 lb (57.6 kg).  General appearance: alert and no distress Neck: no adenopathy, no carotid bruit, no JVD, supple, symmetrical, trachea midline, and thyroid not enlarged, symmetric, no tenderness/mass/nodules Lungs: clear to auscultation bilaterally Heart: regular rate and rhythm, S1, S2 normal, no murmur, click, rub or gallop Extremities: extremities normal, atraumatic, no cyanosis or edema Pulses: 2+ and symmetric Skin: Skin color, texture, turgor normal. No rashes or  lesions Neurologic: Grossly normal  EKG not performed today  ASSESSMENT AND PLAN:   Essential hypertension History of essential hypertension on atenolol and chlorthalidone with blood pressure measured today at 126/66.  Mixed hyperlipidemia History of hyperlipidemia on statin therapy with lipid profile performed 04/27/2022 revealing total cholesterol 157, LDL 89 HDL 51.  We will recheck a lipid liver profile in 3 months.  Chest pain Admission in  early August for chest pain.  Enzymes were negative.  2D echo was normal at that time.  Coronary CTA showed coronary calcium score 5 with moderate disease in a small nondominant right coronary artery but otherwise no significant obstructive disease.  Aortic insufficiency 2D echocardiogram performed during her hospitalization 04/27/2022 showed normal LV systolic function, grade 1 diastolic dysfunction, mild to moderate AI.  2D echo will repeated in 1 year.  PAF (paroxysmal atrial fibrillation) ([place]) Patient was seen in the ER on 06/17/2022 with tachypalpitations.  Her PCP had weaned her off her beta-blocker.  EKG showed short runs of PAF.  She was placed back on a beta-blocker and has had no further symptoms.  She was started on Eliquis oral anticoagulation at that time.  We will check a 30-day event monitor.  I do not think she will need to be on long-term oral anticoagulation.     Lorretta Harp MD FACP,FACC,FAHA, [facility] 06/22/2022 12:14 PM

## 2022-06-22 NOTE — Progress Notes (Signed)
Patient ID: Katelyn Moore, female   DOB: [birth year], 81 y.o.   MRN: [id number] Patient enrolled for [employer] to ship a 30 day cardiac event monitor to her address on file.

## 2022-06-22 NOTE — Assessment & Plan Note (Signed)
Patient was seen in the ER on 06/17/2022 with tachypalpitations.  Her PCP had weaned her off her beta-blocker.  EKG showed short runs of PAF.  She was placed back on a beta-blocker and has had no further symptoms.  She was started on Eliquis oral anticoagulation at that time.  We will check a 30-day event monitor.  I do not think she will need to be on long-term oral anticoagulation.

## 2022-06-22 NOTE — Assessment & Plan Note (Signed)
2D echocardiogram performed during her hospitalization 04/27/2022 showed normal LV systolic function, grade 1 diastolic dysfunction, mild to moderate AI.  2D echo will repeated in 1 year.

## 2022-06-22 NOTE — Telephone Encounter (Signed)
        Patient  visited [place] on 9/30    Telephone encounter attempt :  1st Unable to leave a message   [place]  [phone] [address], [place], [place] [address] Phone: [phone] Email: [email or web address]

## 2022-06-22 NOTE — Patient Instructions (Signed)
Medication Instructions:  Your physician recommends that you continue on your current medications as directed. Please refer to the Current Medication list given to you today.  *If you need a refill on your cardiac medications before your next appointment, please call your pharmacy*   Lab Work: Your physician recommends that you return for lab work in: 3 months for FASTING lipid/liver  If you have labs (blood work) drawn today and your tests are completely normal, you will receive your results only by: [place] (if you have MyChart) OR A paper copy in the mail If you have any lab test that is abnormal or we need to change your treatment, we will call you to review the results.   Testing/Procedures: [employer] Cardiac Event Monitor Instructions Your physician has requested you wear your cardiac event monitor for 30 days. [employer] may call or text to confirm a shipping address. The monitor will be sent to a land address via UPS. [employer] will not ship a monitor to a PO BOX. It typically takes 3-5 days to receive your monitor after it has been enrolled. [employer] will assist with USPS tracking if your package is delayed. The telephone number for [employer] is [phone]. Once you have received your monitor, please review the enclosed instructions. Instruction tutorials can also be viewed under help and settings on the enclosed cell phone. Your monitor has already been registered assigning a specific monitor serial # to you.  Applying the monitor Remove cell phone from case and turn it on. The cell phone works as Dealer and needs to be within [employer] of you at all times. The cell phone will need to be charged on a daily basis. We recommend you plug the cell phone into the enclosed charger at your bedside table every night.  Monitor batteries: You will receive two monitor batteries labelled #1 and #2. These are your recorders. Plug battery #2 onto the second  connection on the enclosed charger. Keep one battery on the charger at all times. This will keep the monitor battery deactivated. It will also keep it fully charged for when you need to switch your monitor batteries. A small light will be blinking on the battery emblem when it is charging. The light on the battery emblem will remain on when the battery is fully charged.  Open package of a Monitor strip. Insert battery #1 into black hood on strip and gently squeeze monitor battery onto connection as indicated in instruction booklet. Set aside while preparing skin.  Choose location for your strip, vertical or horizontal, as indicated in the instruction booklet. Shave to remove all hair from location. There cannot be any lotions, oils, powders, or colognes on skin where monitor is to be applied. Wipe skin clean with enclosed Saline wipe. Dry skin completely.  Peel paper labeled #1 off the back of the Monitor strip exposing the adhesive. Place the monitor on the chest in the vertical or horizontal position shown in the instruction booklet. One arrow on the monitor strip must be pointing upward. Carefully remove paper labeled #2, attaching remainder of strip to your skin. Try not to create any folds or wrinkles in the strip as you apply it.  Firmly press and release the circle in the center of the monitor battery. You will hear a small beep. This is turning the monitor battery on. The heart emblem on the monitor battery will light up every 5 seconds if the monitor battery in turned on and connected to the patient securely.  Do not push and hold the circle down as this turns the monitor battery off. The cell phone will locate the monitor battery. A screen will appear on the cell phone checking the connection of your monitor strip. This may read poor connection initially but change to good connection within the next minute. Once your monitor accepts the connection you will hear a series of 3 beeps  followed by a climbing crescendo of beeps. A screen will appear on the cell phone showing the two monitor strip placement options. Touch the picture that demonstrates where you applied the monitor strip.  Your monitor strip and battery are waterproof. You are able to shower, bathe, or swim with the monitor on. They just ask you do not submerge deeper than 3 feet underwater. We recommend removing the monitor if you are swimming in a lake, river, or ocean.  Your monitor battery will need to be switched to a fully charged monitor battery approximately once a week. The cell phone will alert you of an action which needs to be made.  On the cell phone, tap for details to reveal connection status, monitor battery status, and cell phone battery status. The green dots indicates your monitor is in good status. A red dot indicates there is something that needs your attention.  To record a symptom, click the circle on the monitor battery. In 30-60 seconds a list of symptoms will appear on the cell phone. Select your symptom and tap save. Your monitor will record a sustained or significant arrhythmia regardless of you clicking the button. Some patients do not feel the heart rhythm irregularities. [employer] will notify us of any serious or critical events.  Refer to instruction booklet for instructions on switching batteries, changing strips, the Do not disturb or Pause features, or any additional questions.  Call [employer] at [phone], to confirm your monitor is transmitting and record your baseline. They will answer any questions you may have regarding the monitor instructions at that time.  Returning the monitor to [place] all equipment back into blue box. Peel off strip of paper to expose adhesive and close box securely. There is a prepaid UPS shipping label on this box. Drop in a UPS drop box, or at a UPS facility like [employer]. You may also contact [employer] to arrange UPS to  pick up monitor package at your home.   Your physician has requested that you have an echocardiogram. Echocardiography is a painless test that uses sound waves to create images of your heart. It provides your doctor with information about the size and shape of your heart and how well your heart's chambers and valves are working. This procedure takes approximately one hour. There are no restrictions for this procedure. To be done in August 2024. This procedure will be done at [address]     Follow-Up: At [facility], you and your health needs are our priority.  As part of our continuing mission to provide you with exceptional heart care, we have created designated Provider Care Teams.  These Care Teams include your primary Cardiologist (physician) and Advanced Practice Providers (APPs -  Physician Assistants and Nurse Practitioners) who all work together to provide you with the care you need, when you need it.  We recommend signing up for the patient portal called "MyChart".  Sign up information is provided on this After Visit Summary.  MyChart is used to connect with patients for Virtual Visits (Telemedicine).  Patients are able to  view lab/test results, encounter notes, upcoming appointments, etc.  Non-urgent messages can be sent to your provider as well.   To learn more about what you can do with MyChart, go to [email or web address].    Your next appointment:   3 month(s)  The format for your next appointment:   In Person  Provider:   Quay Burow, MD

## 2022-06-22 NOTE — Assessment & Plan Note (Signed)
History of hyperlipidemia on statin therapy with lipid profile performed 04/27/2022 revealing total cholesterol 157, LDL 89 HDL 51.  We will recheck a lipid liver profile in 3 months.

## 2022-06-22 NOTE — Assessment & Plan Note (Signed)
Admission in early August for chest pain.  Enzymes were negative.  2D echo was normal at that time.  Coronary CTA showed coronary calcium score 5 with moderate disease in a small nondominant right coronary artery but otherwise no significant obstructive disease.

## 2022-07-01 ENCOUNTER — Ambulatory Visit: Payer: Medicare Other | Attending: Cardiovascular Disease

## 2022-07-01 DIAGNOSIS — R079 Chest pain, unspecified: Secondary | ICD-10-CM | POA: Diagnosis not present

## 2022-07-01 DIAGNOSIS — I480 Paroxysmal atrial fibrillation: Secondary | ICD-10-CM | POA: Diagnosis not present

## 2022-07-01 DIAGNOSIS — E782 Mixed hyperlipidemia: Secondary | ICD-10-CM

## 2022-07-01 DIAGNOSIS — I351 Nonrheumatic aortic (valve) insufficiency: Secondary | ICD-10-CM

## 2022-07-01 DIAGNOSIS — I10 Essential (primary) hypertension: Secondary | ICD-10-CM

## 2022-07-13 ENCOUNTER — Telehealth: Payer: Self-pay | Admitting: Cardiovascular Disease

## 2022-07-13 NOTE — Telephone Encounter (Signed)
Attempted to contact pt. Unable to leave message as mailbox is not set up.     Lorretta Harp, MD  You 58 minutes ago (12:16 PM)    Monitor has shown runs of A-fib.  Continue oral anticoagulation.

## 2022-07-13 NOTE — Telephone Encounter (Signed)
   Cardiac Monitor Alert  Date of alert:  07/13/2022   Patient Name: Katelyn Moore  DOB: Aug 31, 1941  MRN: [id number]   [place] Cardiologist: Quay Burow, MD  [facility] EP:  None    Monitor Information: Cardiac Event Monitor [[employer]]  Reason:  Afib Ordering provider:  Dr. Gwenlyn Found   Alert Atrial Fibrillation/Flutter HR 90 This is the 1st alert for this rhythm.  The patient has a hx of Atrial Fibrillation/Flutter.    Anticoagulation medication as of 07/13/2022           apixaban (ELIQUIS) 2.5 MG TABS tablet Take 1 tablet (2.5 mg total) by mouth 2 (two) times daily.       Next Cardiology Appointment   Date:  09/27/22  Provider:  Dr. Gwenlyn Found  The patient could NOT be reached by telephone today.    Will forward to ordering physician as   pt has a history of PAF    Meryl Crutch, RN  07/13/2022 8:05 AM

## 2022-07-13 NOTE — Telephone Encounter (Signed)
Crystal from [facility] with critical EKG results

## 2022-07-14 NOTE — Telephone Encounter (Signed)
Pt updated and verbalized understanding.  

## 2022-07-15 ENCOUNTER — Telehealth: Payer: Self-pay | Admitting: Cardiovascular Disease

## 2022-07-15 ENCOUNTER — Telehealth: Payer: Self-pay | Admitting: Physician Assistant

## 2022-07-15 ENCOUNTER — Other Ambulatory Visit: Payer: Self-pay | Admitting: Physician Assistant

## 2022-07-15 DIAGNOSIS — I480 Paroxysmal atrial fibrillation: Secondary | ICD-10-CM

## 2022-07-15 MED ORDER — BUTALBITAL-APAP-CAFFEINE 50-325-40 MG PO TABS: 1.0000 | ORAL_TABLET | Freq: Four times a day (QID) | ORAL | 3 refills | Status: AC | PRN

## 2022-07-15 MED ORDER — BUTALBITAL-APAP-CAFFEINE 50-325-40 MG PO CAPS: 1.0000 | ORAL_CAPSULE | Freq: Four times a day (QID) | ORAL | 1 refills | Status: DC | PRN

## 2022-07-15 MED ORDER — APIXABAN 2.5 MG PO TABS: 2.5000 mg | ORAL_TABLET | Freq: Two times a day (BID) | ORAL | 1 refills | Status: DC

## 2022-07-15 NOTE — Progress Notes (Signed)
Rx changed from capsule to tablet

## 2022-07-15 NOTE — Telephone Encounter (Signed)
I returned call to patient after she left message stating she was unable to get her medication filled as it was >$100 co-pay.  She has resorted to using maxalt for acute migraine, despite my warning that it could cause major issue considering her new onset of cardiac concern.   She is advised she may come and get samples from the office.   Additionally I will send in rx for Fioricet.  Pt advised to limit use to 2 days per week and not to be used with caffeine (coffee/tea/soft drink or energy drink).

## 2022-07-15 NOTE — Telephone Encounter (Signed)
*  STAT* If patient is at the pharmacy, call can be transferred to refill team.   1. Which medications need to be refilled? (please list name of each medication and dose if known) apixaban (ELIQUIS) 2.5 MG TABS tablet  2. Which pharmacy/location (including street and city if local pharmacy) is medication to be sent to? [place], [place] - [address] AT [address]  3. Do they need a 30 day or 90 day supply? 90   Will be out of medication Sunday 10/29

## 2022-07-15 NOTE — Telephone Encounter (Signed)
Eliquis 2.'5mg'$  refill request received. Patient is 81 years old, weight-57.6kg, Crea-0.74 on 06/17/2022, Diagnosis-Afib, and last seen by Dr. Gwenlyn Found on 06/22/2022. Dose is appropriate based on dosing criteria. Will send in refill to requested pharmacy.

## 2022-07-29 DIAGNOSIS — H524 Presbyopia: Secondary | ICD-10-CM | POA: Diagnosis not present

## 2022-08-09 DIAGNOSIS — L821 Other seborrheic keratosis: Secondary | ICD-10-CM | POA: Diagnosis not present

## 2022-08-09 DIAGNOSIS — L603 Nail dystrophy: Secondary | ICD-10-CM | POA: Diagnosis not present

## 2022-08-09 DIAGNOSIS — L814 Other melanin hyperpigmentation: Secondary | ICD-10-CM | POA: Diagnosis not present

## 2022-08-09 DIAGNOSIS — Z85828 Personal history of other malignant neoplasm of skin: Secondary | ICD-10-CM | POA: Diagnosis not present

## 2022-08-09 DIAGNOSIS — Z85820 Personal history of malignant melanoma of skin: Secondary | ICD-10-CM | POA: Diagnosis not present

## 2022-08-10 DIAGNOSIS — Z0000 Encounter for general adult medical examination without abnormal findings: Secondary | ICD-10-CM | POA: Diagnosis not present

## 2022-08-10 DIAGNOSIS — G43909 Migraine, unspecified, not intractable, without status migrainosus: Secondary | ICD-10-CM | POA: Diagnosis not present

## 2022-08-10 DIAGNOSIS — I480 Paroxysmal atrial fibrillation: Secondary | ICD-10-CM | POA: Diagnosis not present

## 2022-08-10 DIAGNOSIS — R7303 Prediabetes: Secondary | ICD-10-CM | POA: Diagnosis not present

## 2022-08-10 DIAGNOSIS — I10 Essential (primary) hypertension: Secondary | ICD-10-CM | POA: Diagnosis not present

## 2022-08-10 DIAGNOSIS — E782 Mixed hyperlipidemia: Secondary | ICD-10-CM | POA: Diagnosis not present

## 2022-08-22 DIAGNOSIS — M85852 Other specified disorders of bone density and structure, left thigh: Secondary | ICD-10-CM | POA: Diagnosis not present

## 2022-08-22 DIAGNOSIS — Z780 Asymptomatic menopausal state: Secondary | ICD-10-CM | POA: Diagnosis not present

## 2022-08-22 DIAGNOSIS — M85851 Other specified disorders of bone density and structure, right thigh: Secondary | ICD-10-CM | POA: Diagnosis not present

## 2022-09-27 ENCOUNTER — Ambulatory Visit: Payer: BC Managed Care – PPO | Attending: Cardiovascular Disease | Admitting: Cardiovascular Disease

## 2022-09-27 ENCOUNTER — Encounter: Payer: Self-pay | Admitting: *Deleted

## 2022-09-27 ENCOUNTER — Other Ambulatory Visit: Payer: Self-pay | Admitting: *Deleted

## 2022-09-27 ENCOUNTER — Encounter: Payer: Self-pay | Admitting: Cardiovascular Disease

## 2022-09-27 VITALS — BP 136/88 | HR 52 | Ht 60.0 in | Wt 131.8 lb

## 2022-09-27 DIAGNOSIS — I351 Nonrheumatic aortic (valve) insufficiency: Secondary | ICD-10-CM

## 2022-09-27 DIAGNOSIS — E782 Mixed hyperlipidemia: Secondary | ICD-10-CM

## 2022-09-27 DIAGNOSIS — I10 Essential (primary) hypertension: Secondary | ICD-10-CM

## 2022-09-27 DIAGNOSIS — I480 Paroxysmal atrial fibrillation: Secondary | ICD-10-CM

## 2022-09-27 MED ORDER — APIXABAN 2.5 MG PO TABS: 2.5000 mg | ORAL_TABLET | Freq: Two times a day (BID) | ORAL | 3 refills | Status: DC

## 2022-09-27 MED ORDER — UBRELVY 100 MG PO TABS: 100.0000 mg | ORAL_TABLET | ORAL | 6 refills | Status: DC | PRN

## 2022-09-27 NOTE — Patient Instructions (Signed)
Medication Instructions:  Your physician recommends that you continue on your current medications as directed. Please refer to the Current Medication list given to you today.  *If you need a refill on your cardiac medications before your next appointment, please call your pharmacy*   Testing/Procedures: Your physician has requested that you have an echocardiogram. Echocardiography is a painless test that uses sound waves to create images of your heart. It provides your doctor with information about the size and shape of your heart and how well your heart's chambers and valves are working. This procedure takes approximately one hour. There are no restrictions for this procedure. Please do NOT wear cologne, perfume, aftershave, or lotions (deodorant is allowed). Please arrive 15 minutes prior to your appointment time. To be done in August.  This procedure will be done at [address]    Follow-Up: At [facility], you and your health needs are our priority.  As part of our continuing mission to provide you with exceptional heart care, we have created designated Provider Care Teams.  These Care Teams include your primary Cardiologist (physician) and Advanced Practice Providers (APPs -  Physician Assistants and Nurse Practitioners) who all work together to provide you with the care you need, when you need it.  We recommend signing up for the patient portal called "MyChart".  Sign up information is provided on this After Visit Summary.  MyChart is used to connect with patients for Virtual Visits (Telemedicine).  Patients are able to view lab/test results, encounter notes, upcoming appointments, etc.  Non-urgent messages can be sent to your provider as well.   To learn more about what you can do with MyChart, go to [email or web address].    Your next appointment:   12 month(s)  The format for your next appointment:   In Person  Provider:   Quay Burow, MD

## 2022-09-27 NOTE — Progress Notes (Unsigned)
09/27/2022 PANSEY PINHEIRO   1940/12/25  [id number]  Primary Physician Pahwani, Michell Heinrich, MD Primary Cardiologist: Lorretta Harp MD [name], [place]  HPI:  Katelyn Moore is a 82 y.o.  thin-appearing married Caucasian female mother of 2 children, grandmother of 1 grandchild who is a retired Automotive engineer. I last saw her in the office 10//23.  She ruled out for myocardial infarction. She had a coronary CTA that showed a coronary calcium score of 5 with moderate disease in a nondominant RCA but otherwise no obstructive disease. 2D echo showed normal LV function with mild to moderate AI. She does have a history of hypertension on chlorthalidone and atenolol. She was started on rosuvastatin for mildly elevated LDL given her CAD. Her PCP weaned her off her beta-blocker and she was seen on 06/17/2022 with tachypalpitations in the ER with runs of A-fib with RVR. She was placed back on a beta-blocker and has had no further episodes. As result, she was begun on Eliquis oral anticoagulation.  Since I saw her 3 months ago she did have an event monitor performed 08/09/2022 that showed runs of PAF which she is minimally symptomatic from.  She is still on Eliquis oral anticoagulation.  She works out at [employer] 3 days a week, is fairly active and is asymptomatic.   Current Meds  Medication Sig   apixaban (ELIQUIS) 2.5 MG TABS tablet Take 1 tablet (2.5 mg total) by mouth 2 (two) times daily.   atenolol (TENORMIN) 50 MG tablet Take 1 tablet (50 mg total) by mouth daily.   Calcium Carb-Cholecalciferol (CALCIUM + D3 PO) Take 600 mg by mouth in the morning and at bedtime.   chlorthalidone (HYGROTON) 25 MG tablet Take 25 mg by mouth daily.   magnesium 30 MG tablet Take 200 mg by mouth 2 (two) times daily.   Omega-3 Fatty Acids (FISH OIL PO) Take 1,000 mg by mouth once.   Rimegepant Sulfate (NURTEC) 75 MG TBDP Take 75 mg by mouth as needed.   rosuvastatin (CRESTOR) 20 MG tablet Take  1 tablet (20 mg total) by mouth daily.   triamcinolone cream (KENALOG) 0.1 % Apply 1 Application topically 2 (two) times daily.   TURMERIC PO Take by mouth.   Ubrogepant (UBRELVY) 100 MG TABS Take 100 mg by mouth as needed (headache).   vitamin B-12 (CYANOCOBALAMIN) 100 MCG tablet Take 100 mcg by mouth daily.     Allergies  Allergen Reactions   Codeine Other (See Comments)    headaches   Tape Other (See Comments)    Blisters from tape from  Breast biopsy   Morphine And Related Rash    Social History   Socioeconomic History   Marital status: Married    Spouse name: Not on file   Number of children: Not on file   Years of education: Not on file   Highest education level: Not on file  Occupational History   Not on file  Tobacco Use   Smoking status: Never   Smokeless tobacco: Never  Vaping Use   Vaping Use: Never used  Substance and Sexual Activity   Alcohol use: Yes    Comment: Drinks rarely probably  1 glass of wine a year   Drug use: No   Sexual activity: Not on file  Other Topics Concern   Not on file  Social History Narrative   Not on file   Social Determinants of Health   Financial Resource Strain: Not  on file  Food Insecurity: Not on file  Transportation Needs: Not on file  Physical Activity: Not on file  Stress: Not on file  Social Connections: Not on file  Intimate Partner Violence: Not on file     Review of Systems: General: negative for chills, fever, night sweats or weight changes.  Cardiovascular: negative for chest pain, dyspnea on exertion, edema, orthopnea, palpitations, paroxysmal nocturnal dyspnea or shortness of breath Dermatological: negative for rash Respiratory: negative for cough or wheezing Urologic: negative for hematuria Abdominal: negative for nausea, vomiting, diarrhea, bright red blood per rectum, melena, or hematemesis Neurologic: negative for visual changes, syncope, or dizziness All other systems reviewed and are otherwise  negative except as noted above.    Blood pressure 136/88, pulse (!) 52, height 5' (1.524 m), weight 131 lb 12.8 oz (59.8 kg), SpO2 98 %.  General appearance: alert and no distress Neck: no adenopathy, no carotid bruit, no JVD, supple, symmetrical, trachea midline, and thyroid not enlarged, symmetric, no tenderness/mass/nodules Lungs: clear to auscultation bilaterally Heart: regular rate and rhythm, S1, S2 normal, no murmur, click, rub or gallop Extremities: extremities normal, atraumatic, no cyanosis or edema Pulses: 2+ and symmetric Skin: Skin color, texture, turgor normal. No rashes or lesions Neurologic: Grossly normal  EKG not performed today  ASSESSMENT AND PLAN:   Essential hypertension History of essential hypertension blood pressure measured at 136/88.  She is on atenolol and chlorthalidone.  Mixed hyperlipidemia History of hyperlipidemia not on statin therapy with lipid profile performed 08/10/2022 revealing total cholesterol of 135, LDL 62 and HDL 55.  Aortic insufficiency History of mild to moderate aortic insufficiency on 2D echo performed 04/27/2022 with normal LV size and function.  She is totally asymptomatic.  Will repeat this on an annual basis.  PAF (paroxysmal atrial fibrillation) ([place]) History of PAF with recent event monitor performed 08/08/2022 revealing runs of PAF.  She is relatively asymptomatic.  She is on Eliquis oral anticoagulation.  No further evaluation is warranted at this time.     Lorretta Harp MD FACP,FACC,FAHA, [facility] 09/27/2022 10:15 AM

## 2022-09-27 NOTE — Assessment & Plan Note (Signed)
History of hyperlipidemia not on statin therapy with lipid profile performed 08/10/2022 revealing total cholesterol of 135, LDL 62 and HDL 55.

## 2022-09-27 NOTE — Assessment & Plan Note (Signed)
History of essential hypertension blood pressure measured at 136/88.  She is on atenolol and chlorthalidone.

## 2022-09-27 NOTE — Assessment & Plan Note (Signed)
History of mild to moderate aortic insufficiency on 2D echo performed 04/27/2022 with normal LV size and function.  She is totally asymptomatic.  Will repeat this on an annual basis.

## 2022-09-27 NOTE — Assessment & Plan Note (Signed)
History of PAF with recent event monitor performed 08/08/2022 revealing runs of PAF.  She is relatively asymptomatic.  She is on Eliquis oral anticoagulation.  No further evaluation is warranted at this time.

## 2023-02-09 DIAGNOSIS — R001 Bradycardia, unspecified: Secondary | ICD-10-CM | POA: Diagnosis not present

## 2023-02-09 DIAGNOSIS — I700 Atherosclerosis of aorta: Secondary | ICD-10-CM | POA: Diagnosis not present

## 2023-02-09 DIAGNOSIS — I10 Essential (primary) hypertension: Secondary | ICD-10-CM | POA: Diagnosis not present

## 2023-02-09 DIAGNOSIS — I480 Paroxysmal atrial fibrillation: Secondary | ICD-10-CM | POA: Diagnosis not present

## 2023-02-09 DIAGNOSIS — D6869 Other thrombophilia: Secondary | ICD-10-CM | POA: Diagnosis not present

## 2023-03-17 ENCOUNTER — Telehealth: Payer: Self-pay | Admitting: Cardiovascular Disease

## 2023-03-17 NOTE — Telephone Encounter (Signed)
Patient c/o Palpitations:  High priority if patient c/o lightheadedness, shortness of breath, or chest pain  How long have you had palpitations/irregular HR/ Afib? Are you having the symptoms now? Afib- not at this time  Are you currently experiencing lightheadedness, SOB or CP? No- feel weak,feels like she is going to pass out, not at this time  Do you have a history of afib (atrial fibrillation) or irregular heart rhythm? yes   Have you checked your BP or HR? (document readings if available):   Are you experiencing any other symptoms? No- patient wanted to be seen- I made her an appointment for Tuesday(03-21-23) with Gavin Pound- please call to evaluate

## 2023-03-17 NOTE — Telephone Encounter (Signed)
Patient states palpitations.  She is generally unaware until this last week.. She has episodes of feeling a flutter and weak and "feels like I'm going to pass out".  Does not and states "just feel weird all over"  It only last few a minute or so and goes away.  It has happened more. Happens at random times with or without activity. No nausea, sweating, nor SOB. She states last appt with PCP her labs showed low potassium.  She took medication but no follow up labs.  Advised patient to avoid caffeine, alcohol and to stay hydrated.  Advised if episode does not resolve in a few minutes and accompanied by sweating, nausea, chest pain then she should go to the ER.  She states understanding. Appt Tuesday with D. Wittenborn

## 2023-03-20 NOTE — Progress Notes (Unsigned)
Cardiology Clinic Note   Patient Name: Katelyn Moore Date of Encounter: 03/22/2023  Primary Care Provider:  Ollen Bowl, MD Primary Cardiologist:  Nanetta Batty, MD  Patient Profile    82 year old female with history with HTN, Mixed hypercholesterolemia, Aortic insufficiency.PAF, CHADS VASC Score 3.   Past Medical History    Past Medical History:  Diagnosis Date   Cancer (HCC)    Skin cancer - basal cell removed   Headache(784.0)    Hypertension    Melanoma (HCC)    Past Surgical History:  Procedure Laterality Date   ABDOMINAL HYSTERECTOMY     BREAST LUMPECTOMY WITH RADIOACTIVE SEED LOCALIZATION Left 11/24/2014   Procedure: BREAST LUMPECTOMY WITH RADIOACTIVE SEED LOCALIZATION;  Surgeon: Claud Kelp, MD;  Location: [facility];  Service: General;  Laterality: Left;   [place]an Fundal plication     TONSILLECTOMY     TUBAL LIGATION      Allergies  Allergies  Allergen Reactions   Codeine Other (See Comments)    headaches   Tape Other (See Comments)    Blisters from tape from  Breast biopsy   Morphine And Codeine Rash    History of Present Illness    Katelyn Moore comes today with complaints of more fluttering in her chest.  She occasionally feels a "hot flash" across her chest.  She is noticing irregular lower rhythm occurring little more often.  She works out 3 times a week at [employer], and then walks on the other days.  She has never felt her heart racing being sustained but has periods of fluttering that goes away on its own.  She denies any chest pain, shortness of breath, dizziness, or fatigue.  She has been medically compliant on atenolol 50 mg daily, and anticoagulation therapy, Eliquis 2.5 mg twice daily.  Home Medications    Current Outpatient Medications  Medication Sig Dispense Refill   apixaban (ELIQUIS) 2.5 MG TABS tablet Take 1 tablet (2.5 mg total) by mouth 2 (two) times daily. 180 tablet 3   Calcium Carb-Cholecalciferol (CALCIUM +  D3 PO) Take 600 mg by mouth in the morning and at bedtime.     magnesium 30 MG tablet Take 200 mg by mouth 2 (two) times daily.     Omega-3 Fatty Acids (FISH OIL PO) Take 1,000 mg by mouth once.     omeprazole (PRILOSEC) 20 MG capsule Take 1 capsule (20 mg total) by mouth 2 (two) times daily before a meal. 60 capsule 0   Rimegepant Sulfate (NURTEC) 75 MG TBDP Take 75 mg by mouth as needed. 8 tablet 11   rosuvastatin (CRESTOR) 20 MG tablet Take 1 tablet (20 mg total) by mouth daily. 30 tablet 3   triamcinolone cream (KENALOG) 0.1 % Apply 1 Application topically 2 (two) times daily.     TURMERIC PO Take by mouth.     Ubrogepant (UBRELVY) 100 MG TABS Take 100 mg by mouth as needed (headache). 10 tablet 6   vitamin B-12 (CYANOCOBALAMIN) 100 MCG tablet Take 100 mcg by mouth daily.     atenolol (TENORMIN) 50 MG tablet Take 1 tablet (50 mg total) by mouth daily. 90 tablet 3   butalbital-acetaminophen-caffeine (FIORICET) 50-325-40 MG tablet Take 1 tablet by mouth every 6 (six) hours as needed for headache. (Patient not taking: Reported on 03/22/2023) 20 tablet 3   chlorthalidone (HYGROTON) 25 MG tablet Take 1 tablet (25 mg total) by mouth daily. 90 tablet 3   No current facility-administered  medications for this visit.     Family History    No family history on file. has no family status information on file.   Social History    Social History   Socioeconomic History   Marital status: Married    Spouse name: Not on file   Number of children: Not on file   Years of education: Not on file   Highest education level: Not on file  Occupational History   Not on file  Tobacco Use   Smoking status: Never   Smokeless tobacco: Never  Vaping Use   Vaping Use: Never used  Substance and Sexual Activity   Alcohol use: Yes    Comment: Drinks rarely probably  1 glass of wine a year   Drug use: No   Sexual activity: Not on file  Other Topics Concern   Not on file  Social History Narrative   Not on  file   Social Determinants of Health   Financial Resource Strain: Not on file  Food Insecurity: Not on file  Transportation Needs: Not on file  Physical Activity: Not on file  Stress: Not on file  Social Connections: Not on file  Intimate Partner Violence: Not on file     Review of Systems    General:  No chills, fever, night sweats or weight changes.  Cardiovascular:  No chest pain, dyspnea on exertion, edema, orthopnea, palpitations, paroxysmal nocturnal dyspnea. Dermatological: No rash, lesions/masses Respiratory: No cough, dyspnea Urologic: No hematuria, dysuria Abdominal:   No nausea, vomiting, diarrhea, bright red blood per rectum, melena, or hematemesis Neurologic:  No visual changes, wkns, changes in mental status. All other systems reviewed and are otherwise negative except as noted above.  EKG Interpretation Date/Time:  Wednesday March 22 2023 08:37:50 EDT Ventricular Rate:  73 PR Interval:  154 QRS Duration:  90 QT Interval:  406 QTC Calculation: 447 R Axis:   15  Text Interpretation: Sinus rhythm with marked sinus arrhythmia with Premature supraventricular complexes Nonspecific ST abnormality When compared with ECG of 17-Jun-2022 20:29, PREVIOUS ECG IS PRESENT Confirmed by Joni Reining [phone]) on 03/22/2023 9:21:55 AM    Physical Exam    VS:  BP 112/68 (BP Location: Right Arm, Patient Position: Sitting, Cuff Size: Normal)   Pulse 73   Ht 5' (1.524 m)   Wt 130 lb 6.4 oz (59.1 kg)   SpO2 97%   BMI 25.47 kg/m  , BMI Body mass index is 25.47 kg/m.     GEN: Well nourished, well developed, in no acute distress. HEENT: normal. Neck: Supple, no JVD, carotid bruits, or masses. Cardiac: IRRR, no murmurs, rubs, or gallops. No clubbing, cyanosis, edema.  Radials/DP/PT 2+ and equal bilaterally.  Respiratory:  Respirations regular and unlabored, clear to auscultation bilaterally. GI: Soft, nontender, nondistended, BS + x 4. MS: no deformity or atrophy. Skin: warm  and dry, no rash. Neuro:  Strength and sensation are intact. Psych: Normal affect.  EKG Interpretation Date/Time:  Wednesday March 22 2023 08:37:50 EDT Ventricular Rate:  73 PR Interval:  154 QRS Duration:  90 QT Interval:  406 QTC Calculation: 447 R Axis:   15  Text Interpretation: Sinus rhythm with marked sinus arrhythmia with Premature supraventricular complexes Nonspecific ST abnormality When compared with ECG of 17-Jun-2022 20:29, PREVIOUS ECG IS PRESENT Confirmed by Joni Reining [phone]) on 03/22/2023 9:21:55 AM   Lab Results  Component Value Date   WBC 8.7 06/17/2022   HGB 14.1 06/17/2022   HCT 42.2 06/17/2022  MCV 93.0 06/17/2022   PLT 266 06/17/2022   Lab Results  Component Value Date   CREATININE 0.74 06/17/2022   BUN 18 06/17/2022   NA 134 (L) 06/17/2022   K 3.4 (L) 06/17/2022   CL 98 06/17/2022   CO2 25 06/17/2022   Lab Results  Component Value Date   ALT 22 06/17/2022   AST 33 06/17/2022   ALKPHOS 59 06/17/2022   BILITOT 0.7 06/17/2022   Lab Results  Component Value Date   CHOL 157 04/27/2022   HDL 51 04/27/2022   LDLCALC 89 04/27/2022   TRIG 85 04/27/2022   CHOLHDL 3.1 04/27/2022    Lab Results  Component Value Date   HGBA1C 5.3 04/27/2022     Review of Prior Studies Coronary CTA 04/28/2022 4 mm pulmonary nodule seen in the right middle lobe on image 14/11. No evidence of pulmonary infiltrate or pleural effusion.   The visualized portions of the mediastinum and chest wall are unremarkable. Prior Nissen fundoplication incidentally noted.   : 4 mm indeterminate right middle lobe pulmonary nodule. No follow-up needed if patient is low-risk.This recommendation follows the consensus statement: Guidelines for Management of Incidental Pulmonary Nodules Detected on CT Images: From the Fleischner Society 2017; Radiology 2017; 284:228-243.   1. Coronary calcium score of 5 (LAD). This was 15 percentile for age and sex matched control.   2.  Normal coronary origin with LEFT dominance.   3. RCA is a small non dominant artery. There is 50-69% moderate stenosis, soft plaque, at the ostium. Vessel is small, not amenable to PCI.   4. LAD is a large vessel that has one large branching diagonal with MILD 30-49% stenosis proximally, soft plaque. There is proximal LAD calcified plaque 0-24%.   5.  Hiatal hernia.   CAD-RADS 3. Moderate stenosis. Consider symptom-guided anti-ischemic pharmacotherapy as well as risk factor modification per guideline directed care.  Echocardiogram 04/27/2022 1. Left ventricular ejection fraction, by estimation, is 60 to 65%. The  left ventricle has normal function. The left ventricle has no regional  wall motion abnormalities. Left ventricular diastolic parameters are  consistent with Grade I diastolic  dysfunction (impaired relaxation).   2. Right ventricular systolic function is normal. The right ventricular  size is mildly enlarged. There is mildly elevated pulmonary artery  systolic pressure.   3. Left atrial size was mildly dilated.   4. The mitral valve is normal in structure. Trivial mitral valve  regurgitation. No evidence of mitral stenosis.   5. The aortic valve is normal in structure. Aortic valve regurgitation is  mild to moderate. No aortic stenosis is present.   6. The inferior vena cava is normal in size with greater than 50%  respiratory variability, suggesting right atrial pressure of 3 mmHg.   Assessment & Plan   1.  Paroxysmal atrial fibrillation: The patient is having fluttering and feeling of warmth across her chest during periods of fluttering at times.  Review of EKG reveals salvos of PSVT, with normal sinus rhythm, and atrial fibrillation.  Mother reported ZIO monitor on her to evaluate frequency of SVT, and paroxysms of atrial fibrillation.  May need to increase her atenolol from 50 mg daily to 75 mg daily.  Continue Eliquis 2.5 mg twice daily.  The patient has been having a  history of hypokalemia and I will check of being that evaluate potassium status.  She does take Fioricet as needed for headaches but has not been taking this for a good while.  She  is told to Valoid caffeine and alcohol.  2.  Hypertension: Is on atenolol and chlorthalidone.  Rechecking a be met to evaluate potassium status as she most recent potassium level was 3.3 and February 2024.  She was given some supplements for short period of time and is not on any ongoing supplements.  3. Hypercholesterolemia: Checking fasting lipids and LFTs with goal of LDL less than 100.  Continue rosuvastatin 20 mg daily.  Dizziness that he had on  Signed, Bettey Mare. Liborio Nixon, ANP, AACC   03/22/2023 10:21 AM      Office [phone] Fax [phone]  Notice: This dictation was prepared with Dragon dictation along with smaller phrase technology. Any transcriptional errors that result from this process are unintentional and may not be corrected upon review.

## 2023-03-21 ENCOUNTER — Ambulatory Visit: Payer: Medicare Other | Admitting: Student

## 2023-03-22 ENCOUNTER — Ambulatory Visit (INDEPENDENT_AMBULATORY_CARE_PROVIDER_SITE_OTHER): Payer: Medicare Other

## 2023-03-22 ENCOUNTER — Encounter: Payer: Self-pay | Admitting: Adult Health

## 2023-03-22 ENCOUNTER — Ambulatory Visit: Payer: Medicare Other | Attending: Student | Admitting: Adult Health

## 2023-03-22 VITALS — BP 112/68 | HR 73 | Ht 60.0 in | Wt 130.4 lb

## 2023-03-22 DIAGNOSIS — I480 Paroxysmal atrial fibrillation: Secondary | ICD-10-CM

## 2023-03-22 DIAGNOSIS — I471 Supraventricular tachycardia, unspecified: Secondary | ICD-10-CM | POA: Diagnosis not present

## 2023-03-22 DIAGNOSIS — Z79899 Other long term (current) drug therapy: Secondary | ICD-10-CM

## 2023-03-22 LAB — LIPID PANEL
Chol/HDL Ratio: 2.2 ratio (ref 0.0–4.4)
Cholesterol, Total: 128 mg/dL (ref 100–199)
HDL: 59 mg/dL (ref >39)
LDL Chol Calc (NIH): 51 mg/dL (ref 0–99)
Triglycerides: 97 mg/dL (ref 0–149)
VLDL Cholesterol Cal: 18 mg/dL (ref 5–40)

## 2023-03-22 LAB — COMPREHENSIVE METABOLIC PANEL
ALT: 21 IU/L (ref 0–32)
AST: 24 IU/L (ref 0–40)
Albumin: 4.5 g/dL (ref 3.7–4.7)
Alkaline Phosphatase: 67 IU/L (ref 44–121)
BUN/Creatinine Ratio: 22 (ref 12–28)
BUN: 18 mg/dL (ref 8–27)
Bilirubin Total: 0.7 mg/dL (ref 0.0–1.2)
CO2: 28 mmol/L (ref 20–29)
Calcium: 9.9 mg/dL (ref 8.7–10.3)
Chloride: 96 mmol/L (ref 96–106)
Creatinine, Ser: 0.83 mg/dL (ref 0.57–1.00)
Globulin, Total: 2 g/dL (ref 1.5–4.5)
Glucose: 114 mg/dL — ABNORMAL HIGH (ref 70–99)
Potassium: 3.7 mmol/L (ref 3.5–5.2)
Sodium: 137 mmol/L (ref 134–144)
Total Protein: 6.5 g/dL (ref 6.0–8.5)
eGFR: 71 mL/min/{1.73_m2} (ref >59)

## 2023-03-22 MED ORDER — CHLORTHALIDONE 25 MG PO TABS: 25.0000 mg | ORAL_TABLET | Freq: Every day | ORAL | 3 refills | Status: DC

## 2023-03-22 MED ORDER — ATENOLOL 50 MG PO TABS: 50.0000 mg | ORAL_TABLET | Freq: Every day | ORAL | 3 refills | Status: DC

## 2023-03-22 NOTE — Patient Instructions (Addendum)
Medication Instructions:  No Changes *If you need a refill on your cardiac medications before your next appointment, please call your pharmacy*   Lab Work: CMET, Lipid Panel If you have labs (blood work) drawn today and your tests are completely normal, you will receive your results only by: MyChart Message (if you have MyChart) OR A paper copy in the mail If you have any lab test that is abnormal or we need to change your treatment, we will call you to review the results.   Testing/Procedures: ZIO AT Long term monitor-Live Telemetry  Your physician has requested you wear a ZIO patch monitor for 7 days.  This is a single patch monitor. [employer] supplies one patch monitor per enrollment. Additional  stickers are not available.  Please do not apply patch if you will be having a Nuclear Stress Test, Echocardiogram, Cardiac CT, MRI,  or Chest Xray during the period you would be wearing the monitor. The patch cannot be worn during  these tests. You cannot remove and re-apply the ZIO AT patch monitor.  Your ZIO patch monitor will be mailed 3 day USPS to your address on file. It may take 3-5 days to  receive your monitor after you have been enrolled.  Once you have received your monitor, please review the enclosed instructions. Your monitor has  already been registered assigning a specific monitor serial # to you.   Billing and Patient Assistance Program information  Meredeth Ide has been supplied with any insurance information on record for billing. [employer] offers a sliding scale Patient Assistance Program for patients without insurance, or whose  insurance does not completely cover the cost of the ZIO patch monitor. You must apply for the  Patient Assistance Program to qualify for the discounted rate. To apply, call [employer] at [phone],  select option 4, select option 2 , ask to apply for the Patient Assistance Program, (you can request an  interpreter if needed). [employer] will ask your  household income and how many people are in your  household. [employer] will quote your out-of-pocket cost based on this information. They will also be able  to set up a 12 month interest free payment plan if needed.  Applying the monitor   Shave hair from upper left chest.  Hold the abrader disc by orange tab. Rub the abrader in 40 strokes over left upper chest as indicated in  your monitor instructions.  Clean area with 4 enclosed alcohol pads. Use all pads to ensure the area is cleaned thoroughly. Let  dry.  Apply patch as indicated in monitor instructions. Patch will be placed under collarbone on left side of  chest with arrow pointing upward.  Rub patch adhesive wings for 2 minutes. Remove the white label marked "1". Remove the white label  marked "2". Rub patch adhesive wings for 2 additional minutes.  While looking in a mirror, press and release button in center of patch. A small green light will flash 3-4  times. This will be your only indicator that the monitor has been turned on.  Do not shower for the first 24 hours. You may shower after the first 24 hours.  Press the button if you feel a symptom. You will hear a small click. Record Date, Time and Symptom in  the Patient Log.   Starting the Gateway  In your kit there is a Audiological scientist box the size of a cellphone. This is Buyer, retail. It transmits all your  recorded data to [facility]. This box  must always stay within 10 feet of you. Open the box and push the *  button. There will be a light that blinks orange and then green a few times. When the light stops  blinking, the Gateway is connected to the ZIO patch. Call [employer] at [phone] to confirm your monitor is transmitting.  Returning your monitor  Remove your patch and place it inside the Gateway. In the lower half of the Gateway there is a white  bag with prepaid postage on it. Place Gateway in bag and seal. Mail package back to [employer] as soon as  possible. Your  physician should have your final report approximately 7 days after you have mailed back  your monitor. Call [facility] Customer Care at [phone] if you have questions regarding your ZIO AT  patch monitor. Call them immediately if you see an orange light blinking on your monitor.  If your monitor falls off in less than 4 days, contact our Monitor department at [phone]. If your  monitor becomes loose or falls off after 4 days call [employer] at [phone] for suggestions on  securing your monitor    Follow-Up: At [facility], you and your health needs are our priority.  As part of our continuing mission to provide you with exceptional heart care, we have created designated Provider Care Teams.  These Care Teams include your primary Cardiologist (physician) and Advanced Practice Providers (APPs -  Physician Assistants and Nurse Practitioners) who all work together to provide you with the care you need, when you need it.  We recommend signing up for the patient portal called "MyChart".  Sign up information is provided on this After Visit Summary.  MyChart is used to connect with patients for Virtual Visits (Telemedicine).  Patients are able to view lab/test results, encounter notes, upcoming appointments, etc.  Non-urgent messages can be sent to your provider as well.   To learn more about what you can do with MyChart, go to [email or web address].    Your next appointment:   1 month(s)  Provider:   Joni Reining, DNP, ANP      Other Instructions If Flutters are consistent go to Emergency Room.

## 2023-03-22 NOTE — Progress Notes (Unsigned)
Enrolled patient for a 7 day Zio XT monitor to be mailed to patients home  [place] [address]

## 2023-03-24 ENCOUNTER — Telehealth: Payer: Self-pay

## 2023-03-24 NOTE — Telephone Encounter (Addendum)
Called patient regarding results. Patient had understanding of results.----- Message from Jodelle Gross, NP sent at 03/22/2023  5:06 PM EDT ----- I have reviewed his labs.  All are essentially within normal limits.  No concerns here.  Continue current medication regimen.

## 2023-03-26 DIAGNOSIS — I480 Paroxysmal atrial fibrillation: Secondary | ICD-10-CM

## 2023-04-05 DIAGNOSIS — I480 Paroxysmal atrial fibrillation: Secondary | ICD-10-CM | POA: Diagnosis not present

## 2023-04-05 DIAGNOSIS — Z1231 Encounter for screening mammogram for malignant neoplasm of breast: Secondary | ICD-10-CM | POA: Diagnosis not present

## 2023-04-12 ENCOUNTER — Telehealth: Payer: Self-pay

## 2023-04-12 NOTE — Telephone Encounter (Addendum)
Called patient regarding results. Patient had understanding of results.----- Message from Joni Reining sent at 04/12/2023  7:14 AM EDT ----- I have reviewed the Zio report. She is having frequent racing heart rates, and other arrhythmias. We will talk about this on her appointment in August. If you can get her in to see Dr. Allyson Sabal sooner that would be helpful, as he asked to see her, if APP isn't planned to see her soon.   Kl

## 2023-05-01 NOTE — Progress Notes (Unsigned)
Cardiology Clinic Note   Patient Name: Katelyn Moore Date of Encounter: 05/03/2023  Primary Care Provider:  Ollen Bowl, MD Primary Cardiologist:  Nanetta Batty, MD  Patient Profile    82 year old female with history with HTN, Mixed hypercholesterolemia, Aortic insufficiency.PAF, CHADS VASC Score 3.   Past Medical History    Past Medical History:  Diagnosis Date   Cancer (HCC)    Skin cancer - basal cell removed   Headache(784.0)    Hypertension    Melanoma (HCC)    Past Surgical History:  Procedure Laterality Date   ABDOMINAL HYSTERECTOMY     BREAST LUMPECTOMY WITH RADIOACTIVE SEED LOCALIZATION Left 11/24/2014   Procedure: BREAST LUMPECTOMY WITH RADIOACTIVE SEED LOCALIZATION;  Surgeon: Claud Kelp, MD;  Location: [facility];  Service: General;  Laterality: Left;   Nissan Fundal plication     TONSILLECTOMY     TUBAL LIGATION      Allergies  Allergies  Allergen Reactions   Codeine Other (See Comments)    headaches   Tape Other (See Comments)    Blisters from tape from  Breast biopsy   Morphine And Codeine Rash    History of Present Illness    Katelyn Moore returns today for ongoing assessment management of PAF, hypertension, hyperlipidemia, CAD with mild LAD stenosis.  On last office visit she was complaining of rapid heart rhythm.  I placed a ZIO monitor to evaluate for frequency of her symptoms related to possible arrhythmias.  She was having frequent racing heart rate with short runs of idioventricular rhythm, frequent long runs of SVT, frequent PACs and occasional PVCs,, sinus rhythm sinus bradycardia and sinus tachycardia were noted.  SVE's were frequent 6.5% of the time.   She comes today for discussion of ZIO monitor.  She states she can really feel it when her heart rate is elevated, and then skipping, she states she just feels weak.  She states that if she is driving she will pull over.  She is a very active 82 year old woman who  works out in the mornings, doing Education administrator, and walking.  During workouts she is does not have any the symptoms her heart rate goes up normally.  Symptoms are most notable at rest.  He denies chest pain with exercise, excessive shortness of breath, no dizziness or presyncope with irregular heart rhythm or heart racing.  Home Medications    Current Outpatient Medications  Medication Sig Dispense Refill   apixaban (ELIQUIS) 2.5 MG TABS tablet Take 1 tablet (2.5 mg total) by mouth 2 (two) times daily. 180 tablet 3   atenolol (TENORMIN) 50 MG tablet Take 1 tablet (50 mg total) by mouth daily. 90 tablet 3   Calcium Carb-Cholecalciferol (CALCIUM + D3 PO) Take 600 mg by mouth in the morning and at bedtime.     chlorthalidone (HYGROTON) 25 MG tablet Take 1 tablet (25 mg total) by mouth daily. 90 tablet 3   magnesium 30 MG tablet Take 200 mg by mouth 2 (two) times daily.     Omega-3 Fatty Acids (FISH OIL PO) Take 1,000 mg by mouth once.     Rimegepant Sulfate (NURTEC) 75 MG TBDP Take 75 mg by mouth as needed. 8 tablet 11   rosuvastatin (CRESTOR) 20 MG tablet Take 1 tablet (20 mg total) by mouth daily. 30 tablet 3   triamcinolone cream (KENALOG) 0.1 % Apply 1 Application topically 2 (two) times daily.     TURMERIC PO Take by mouth.  Ubrogepant (UBRELVY) 100 MG TABS Take 100 mg by mouth as needed (headache). 10 tablet 6   vitamin B-12 (CYANOCOBALAMIN) 100 MCG tablet Take 100 mcg by mouth daily.     butalbital-acetaminophen-caffeine (FIORICET) 50-325-40 MG tablet Take 1 tablet by mouth every 6 (six) hours as needed for headache. (Patient not taking: Reported on 03/22/2023) 20 tablet 3   omeprazole (PRILOSEC) 20 MG capsule Take 1 capsule (20 mg total) by mouth 2 (two) times daily before a meal. (Patient not taking: Reported on 05/03/2023) 60 capsule 0   No current facility-administered medications for this visit.     Family History    History reviewed. No pertinent family history. has no family  status information on file.   Social History    Social History   Socioeconomic History   Marital status: Married    Spouse name: Not on file   Number of children: Not on file   Years of education: Not on file   Highest education level: Not on file  Occupational History   Not on file  Tobacco Use   Smoking status: Never   Smokeless tobacco: Never  Vaping Use   Vaping status: Never Used  Substance and Sexual Activity   Alcohol use: Yes    Comment: Drinks rarely probably  1 glass of wine a year   Drug use: No   Sexual activity: Not on file  Other Topics Concern   Not on file  Social History Narrative   Not on file   Social Determinants of Health   Financial Resource Strain: Not on file  Food Insecurity: Not on file  Transportation Needs: Not on file  Physical Activity: Not on file  Stress: Not on file  Social Connections: Not on file  Intimate Partner Violence: Not on file     Review of Systems    General:  No chills, fever, night sweats or weight changes.  Cardiovascular:  No chest pain, dyspnea on exertion, edema, orthopnea, palpitations, paroxysmal nocturnal dyspnea. Dermatological: No rash, lesions/masses Respiratory: No cough, dyspnea Urologic: No hematuria, dysuria Abdominal:   No nausea, vomiting, diarrhea, bright red blood per rectum, melena, or hematemesis Neurologic:  No visual changes, wkns, changes in mental status. All other systems reviewed and are otherwise negative except as noted above.       Physical Exam    VS:  BP 118/74   Pulse (!) 53   Ht 5' (1.524 m)   Wt 132 lb 6.4 oz (60.1 kg)   SpO2 97%   BMI 25.86 kg/m  , BMI Body mass index is 25.86 kg/m.     GEN: Well nourished, well developed, in no acute distress. HEENT: normal. Neck: Supple, no JVD, carotid bruits, or masses. Cardiac: IRRR, no murmurs, rubs, or gallops. No clubbing, cyanosis, edema.  Radials/DP/PT 2+ and equal bilaterally.  Respiratory:  Respirations regular and  unlabored, clear to auscultation bilaterally. GI: Soft, nontender, nondistended, BS + x 4. MS: no deformity or atrophy. Skin: warm and dry, no rash. Neuro:  Strength and sensation are intact. Psych: Normal affect.      Lab Results  Component Value Date   WBC 8.7 06/17/2022   HGB 14.1 06/17/2022   HCT 42.2 06/17/2022   MCV 93.0 06/17/2022   PLT 266 06/17/2022   Lab Results  Component Value Date   CREATININE 0.83 03/22/2023   BUN 18 03/22/2023   NA 137 03/22/2023   K 3.7 03/22/2023   CL 96 03/22/2023   CO2 28 03/22/2023  Lab Results  Component Value Date   ALT 21 03/22/2023   AST 24 03/22/2023   ALKPHOS 67 03/22/2023   BILITOT 0.7 03/22/2023   Lab Results  Component Value Date   CHOL 128 03/22/2023   HDL 59 03/22/2023   LDLCALC 51 03/22/2023   TRIG 97 03/22/2023   CHOLHDL 2.2 03/22/2023    Lab Results  Component Value Date   HGBA1C 5.3 04/27/2022     Review of Prior Studies  Zio Monitor 04/05/2023  Patient had a min HR of 45 bpm, max HR of 197 bpm, and avg HR of 68 bpm. Predominant underlying rhythm was Sinus Rhythm. 1 run of Ventricular Tachycardia occurred lasting 5 beats with a max rate of 174 bpm (avg 116 bpm). 16109 Supraventricular  Tachycardia runs occurred, the run with the fastest interval lasting 7.8 secs with a max rate of 197 bpm, the longest lasting 44.9 secs with an avg rate of 158 bpm. Some episodes of Supraventricular Tachycardia may be possible Atrial Tachycardia with  variable block. Idioventricular Rhythm was present. Supraventricular Tachycardia was detected within +/- 45 seconds of symptomatic patient event(s). Isolated SVEs were occasional (2.0%, 11612), SVE Couplets were frequent (6.5%, 19257), and SVE Triplets  were occasional (2.2%, 4298). Isolated VEs were rare (<1.0%, 1204), VE Couplets were rare (<1.0%, 53), and VE Triplets were rare (<1.0%, 32). Ventricular Trigeminy was present.  SR/SB/ST Freq PACs, occasional PVCs Freq long runs of  SVT assoc with symptoms. Short runs of idioventricular rhythm    Coronary CTA 04/28/2022 4 mm pulmonary nodule seen in the right middle lobe on image 14/11. No evidence of pulmonary infiltrate or pleural effusion.   The visualized portions of the mediastinum and chest wall are unremarkable. Prior Nissen fundoplication incidentally noted.   : 4 mm indeterminate right middle lobe pulmonary nodule. No follow-up needed if patient is low-risk.This recommendation follows the consensus statement: Guidelines for Management of Incidental Pulmonary Nodules Detected on CT Images: From the Fleischner Society 2017; Radiology 2017; 284:228-243.    1. Coronary calcium score of 5 (LAD). This was 15 percentile for age and sex matched control.   2. Normal coronary origin with LEFT dominance.   3. RCA is a small non dominant artery. There is 50-69% moderate stenosis, soft plaque, at the ostium. Vessel is small, not amenable to PCI.   4. LAD is a large vessel that has one large branching diagonal with MILD 30-49% stenosis proximally, soft plaque. There is proximal LAD calcified plaque 0-24%.   5.  Hiatal hernia.  Echocardiogram 04/27/2022 1. Left ventricular ejection fraction, by estimation, is 60 to 65%. The  left ventricle has normal function. The left ventricle has no regional  wall motion abnormalities. Left ventricular diastolic parameters are  consistent with Grade I diastolic  dysfunction (impaired relaxation).   2. Right ventricular systolic function is normal. The right ventricular  size is mildly enlarged. There is mildly elevated pulmonary artery  systolic pressure.   3. Left atrial size was mildly dilated.   4. The mitral valve is normal in structure. Trivial mitral valve  regurgitation. No evidence of mitral stenosis.   5. The aortic valve is normal in structure. Aortic valve regurgitation is  mild to moderate. No aortic stenosis is present.   6. The inferior vena cava is normal  in size with greater than 50%  respiratory variability, suggesting right atrial pressure of 3 mmHg.      Assessment & Plan   1.  Abnormal ZIO monitor: Patient  is having frequent episodes of idioventricular rhythm, frequent long runs of SVT, frequent PACs, periods of bradycardia.  The patient is symptomatic with this feeling weak, "out of control", but has not had any syncopal episodes or chest pain associated with these rhythms.   She is on atenolol 50 mg daily.  I considered increasing this however with the bradycardia I was concerned about worsening symptoms.  I am going to refer her to electrophysiology, Dr. Lalla Brothers on first available to evaluate her further and consider other medication regimens or intervention at their discretion.  2.  History of PAF: ZIO monitor did not reveal any atrial fibrillation or paroxysms.  Will continue her on apixaban 2.5 mg twice daily for now.  3.  Hypercholesterolemia: Remains on rosuvastatin 20 mg daily, goal of LDL less than 70 with known history of CAD.  4.  Coronary artery disease: Coronary calcium score of 5, 50 to 69% moderate stenosis soft plaque with at the ostium of the RCA.  The vessel is small and not amendable to PCI.  LAD large vessel with diagonal with mild 30 to 49% stenosis proximately.  There was proximal LAD calcified plaque of 0 to 24%.  Continue secondary management with blood pressure control, statin therapy, continue purposeful exercise and weight management.       Signed, Bettey Mare. Liborio Nixon, ANP, AACC   05/03/2023 12:03 PM      Office [phone] Fax [phone]  Notice: This dictation was prepared with Dragon dictation along with smaller phrase technology. Any transcriptional errors that result from this process are unintentional and may not be corrected upon review.

## 2023-05-03 ENCOUNTER — Ambulatory Visit: Payer: Medicare Other | Attending: Adult Health | Admitting: Adult Health

## 2023-05-03 ENCOUNTER — Encounter: Payer: Self-pay | Admitting: Adult Health

## 2023-05-03 VITALS — BP 118/74 | HR 53 | Ht 60.0 in | Wt 132.4 lb

## 2023-05-03 DIAGNOSIS — I2510 Atherosclerotic heart disease of native coronary artery without angina pectoris: Secondary | ICD-10-CM | POA: Diagnosis not present

## 2023-05-03 DIAGNOSIS — E7800 Pure hypercholesterolemia, unspecified: Secondary | ICD-10-CM | POA: Diagnosis not present

## 2023-05-03 DIAGNOSIS — I480 Paroxysmal atrial fibrillation: Secondary | ICD-10-CM

## 2023-05-03 DIAGNOSIS — I495 Sick sinus syndrome: Secondary | ICD-10-CM

## 2023-05-03 NOTE — Patient Instructions (Addendum)
Medication Instructions:  No Changes *If you need a refill on your cardiac medications before your next appointment, please call your pharmacy*   Lab Work: No Labs If you have labs (blood work) drawn today and your tests are completely normal, you will receive your results only by: MyChart Message (if you have MyChart) OR A paper copy in the mail If you have any lab test that is abnormal or we need to change your treatment, we will call you to review the results.   Testing/Procedures: No Testing   Follow-Up: At [facility], you and your health needs are our priority.  As part of our continuing mission to provide you with exceptional heart care, we have created designated Provider Care Teams.  These Care Teams include your primary Cardiologist (physician) and Advanced Practice Providers (APPs -  Physician Assistants and Nurse Practitioners) who all work together to provide you with the care you need, when you need it.  We recommend signing up for the patient portal called "MyChart".  Sign up information is provided on this After Visit Summary.  MyChart is used to connect with patients for Virtual Visits (Telemedicine).  Patients are able to view lab/test results, encounter notes, upcoming appointments, etc.  Non-urgent messages can be sent to your provider as well.   To learn more about what you can do with MyChart, go to [email or web address].    Your next appointment:   To Be Determined ( Post Visit with EP)  Provider:   Joni Reining, ANP,DNP

## 2023-05-19 ENCOUNTER — Ambulatory Visit (HOSPITAL_COMMUNITY): Payer: Medicare Other | Attending: Cardiovascular Disease

## 2023-05-19 DIAGNOSIS — I10 Essential (primary) hypertension: Secondary | ICD-10-CM | POA: Diagnosis not present

## 2023-05-19 DIAGNOSIS — R079 Chest pain, unspecified: Secondary | ICD-10-CM | POA: Diagnosis not present

## 2023-05-19 DIAGNOSIS — E782 Mixed hyperlipidemia: Secondary | ICD-10-CM

## 2023-05-19 DIAGNOSIS — I351 Nonrheumatic aortic (valve) insufficiency: Secondary | ICD-10-CM | POA: Diagnosis not present

## 2023-05-19 DIAGNOSIS — I480 Paroxysmal atrial fibrillation: Secondary | ICD-10-CM | POA: Diagnosis not present

## 2023-05-19 LAB — ECHOCARDIOGRAM COMPLETE
Area-P 1/2: 3.42 cm2
P 1/2 time: 557 msec
S' Lateral: 3.4 cm

## 2023-06-21 ENCOUNTER — Ambulatory Visit: Payer: Medicare Other | Admitting: Cardiology

## 2023-06-21 NOTE — Progress Notes (Unsigned)
Electrophysiology Office Note:   Date:  06/22/2023  ID:  Katelyn Moore, DOB 12-Mar-1941, MRN [id number]  Primary Cardiologist: Nanetta Batty, MD Electrophysiologist: Nobie Putnam, MD      History of Present Illness:   Katelyn Moore is a 82 y.o. 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  Patient has a reported history of paroxysmal atrial fibrillation.  She had recently been seen by general cardiology and complained of palpitations.  A ZIO monitor was ordered.  Monitor did not show any episodes of atrial fibrillation but did show frequent episodes of SVT, during which she often had patient triggered events.  She reports that she has palpitations often daily, but many of these are short and not accompanied by other symptoms.  However, about every couple of weeks she has a more sustained episode of palpitations and rapid heart rates that is accompanied by dizziness and lightheadedness.  These episodes can last hours.  Review of systems complete and found to be negative unless listed in HPI.   EP Information / Studies Reviewed:    EKG is ordered today. Personal review as below.  EKG Interpretation Date/Time:  Thursday June 22 2023 09:42:07 EDT Ventricular Rate:  57 PR Interval:  162 QRS Duration:  92 QT Interval:  450 QTC Calculation: 438 R Axis:   31  Text Interpretation: Sinus bradycardia with Premature atrial complexes Nonspecific ST abnormality When compared with ECG of 22-Mar-2023 08:37, No significant change was found Confirmed by Nobie Putnam [phone]) on 06/22/2023 9:46:22 AM  Zio monitor 03/22/23: Patient had a min HR of 45 bpm, max HR of 197 bpm, and avg HR of 68 bpm. Predominant underlying rhythm was Sinus Rhythm. 1 run of Ventricular Tachycardia occurred lasting 5 beats with a max rate of  174 bpm (avg 116 bpm). 84132 Supraventricular  Tachycardia runs occurred, the run with the fastest interval lasting 7.8 secs with a max rate of 197 bpm, the longest lasting 44.9 secs with an avg rate of 158 bpm. Some episodes of Supraventricular Tachycardia may be possible Atrial Tachycardia with  variable block. Idioventricular Rhythm was present. Supraventricular Tachycardia was detected within +/- 45 seconds of symptomatic patient event(s). Isolated SVEs were occasional (2.0%, 11612), SVE Couplets were frequent (6.5%, 19257), and SVE Triplets  were occasional (2.2%, 4298). Isolated VEs were rare (<1.0%, 1204), VE Couplets were rare (<1.0%, 53), and VE Triplets were rare (<1.0%, 32). Ventricular Trigeminy was present.    SR/SB/ST Freq PACs, occasional PVCs Freq long runs of SVT assoc with symptoms. Short runs of idioventricular rhythm  Echocardiogram 05/19/2023: Normal LV size and function.  No wall motion abnormalities.  Grade 1 diastolic dysfunction. Normal RV size and function. Mildly dilated left atrium.  Right atrium normal in size. Mild aortic regurgitation.  No other valvular disease mentioned.  Coronary CTA 04/28/2022: 50 to 69% ostial stenosis of a small, nondominant RCA.  30 to 50% stenosis of the proximal LAD.  Risk Assessment/Calculations:    CHA2DS2-VASc Score = 4   This indicates a 4.8% annual risk of stroke. The patient's score is based upon: CHF History: 0 HTN History: 1 Diabetes History: 0 Stroke History: 0 Vascular Disease History: 0 Age Score: 2 Gender Score: 1    Physical Exam:   VS:  BP (!) 142/68   Pulse (!) 57   Ht 5' (1.524 m)   Wt 129 lb (58.5  kg)   SpO2 96%   BMI 25.19 kg/m    Wt Readings from Last 3 Encounters:  06/22/23 129 lb (58.5 kg)  05/03/23 132 lb 6.4 oz (60.1 kg)  03/22/23 130 lb 6.4 oz (59.1 kg)     GEN: Well nourished, well developed in no acute distress NECK: No JVD; No carotid bruits CARDIAC: Irregularly irregular rate and  rhythm, no murmurs, rubs, gallops RESPIRATORY:  Clear to auscultation without rales, wheezing or rhonchi  ABDOMEN: Soft, non-tender, non-distended EXTREMITIES:  No edema; No deformity   ASSESSMENT AND PLAN:   Katelyn Moore is a 82 y.o. 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  Patient recently wore a ZIO monitor and had multiple episodes of SVT.  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  These episodes appear to start with a PAC and a longer PR interval, which would be more characteristic for typical AVNRT and perhaps she just has a slowly conducting retrograde fast pathway. Episodes appear to always terminate with V which could be seen with either AVNRT or atrial tachycardia.  She does have PACs on EKG as well. She is symptomatic with these episodes.  He also has a diagnosis of paroxysmal atrial fibrillation although this was not seen on her Zio monitor.  She did not appear to have any episodes of significant bradycardia during waking hours on her monitor.  #SVT, symptomatic: Patient would like to avoid procedures, if possible.  Resting heart rate is mainly in the 60s during waking hours, so medications are somewhat limited by inducing bradycardia.  She has a history of nonobstructive CAD with a normal echo. -We will go up on her atenolol to 75 mg/day.  She will closely monitor her heart rates at home and if she notices significant bradycardia then she will go back to her dose of 50 mg/day.  We will repeat a ZIO monitor to monitor for bradycardia after this change. -We will obtain a PET stress.  If there is no evidence of ischemia or scar, then we can start flecainide 50 mg twice daily.  If she has evidence of ischemia or scar on her imaging  study, then antiarrhythmic drug options would be sotalol, dofetilide, or amiodarone.    #Paroxsymal atrial fibrillation:  -She did not have any episodes of atrial fibrillation on her ZIO monitor.  Her primary reasons for symptoms appear to be SVT.  I have not yet seen definitive evidence of atrial fibrillation though she has risk factors. -Continue atenolol as above.   # Secondary hypercoagulable state due to atrial fibrillation: -Continue Eliquis 2.5 mg twice daily for CHA2DS2-VASc of score of 4.  Follow up with Dr. Jimmey Ralph  in 8 weeks.   Signed, Nobie Putnam, MD

## 2023-06-22 ENCOUNTER — Ambulatory Visit (INDEPENDENT_AMBULATORY_CARE_PROVIDER_SITE_OTHER): Payer: Medicare Other

## 2023-06-22 ENCOUNTER — Ambulatory Visit: Payer: Medicare Other | Attending: Cardiology | Admitting: Cardiology

## 2023-06-22 ENCOUNTER — Encounter: Payer: Self-pay | Admitting: Cardiology

## 2023-06-22 VITALS — BP 142/68 | HR 57 | Ht 60.0 in | Wt 129.0 lb

## 2023-06-22 DIAGNOSIS — I480 Paroxysmal atrial fibrillation: Secondary | ICD-10-CM

## 2023-06-22 DIAGNOSIS — I471 Supraventricular tachycardia, unspecified: Secondary | ICD-10-CM | POA: Diagnosis not present

## 2023-06-22 DIAGNOSIS — I2510 Atherosclerotic heart disease of native coronary artery without angina pectoris: Secondary | ICD-10-CM | POA: Diagnosis not present

## 2023-06-22 DIAGNOSIS — D6869 Other thrombophilia: Secondary | ICD-10-CM

## 2023-06-22 MED ORDER — ATENOLOL 50 MG PO TABS: 75.0000 mg | ORAL_TABLET | Freq: Every day | ORAL | 3 refills | Status: DC

## 2023-06-22 NOTE — Patient Instructions (Signed)
Medication Instructions:  Your physician has recommended you make the following change in your medication:  1) INCREASE atenolol to 75 mg daily *If you need a refill on your cardiac medications before your next appointment, please call your pharmacy*   Testing/Procedures: Your physician has recommended that yo have a cardiac PET CT scan. Please see instruction letter give to you today.   Your physician has recommended that you wear an event monitor. Event monitors are medical devices that record the heart's electrical activity. Doctors most often [place] these monitors to diagnose arrhythmias. Arrhythmias are problems with the speed or rhythm of the heartbeat. The monitor is a small, portable device. You can wear one while you do your normal daily activities. This is usually used to diagnose what is causing palpitations/syncope (passing out).  Follow-Up: At [facility], you and your health needs are our priority.  As part of our continuing mission to provide you with exceptional heart care, we have created designated Provider Care Teams.  These Care Teams include your primary Cardiologist (physician) and Advanced Practice Providers (APPs -  Physician Assistants and Nurse Practitioners) who all work together to provide you with the care you need, when you need it.  Your next appointment:   3 months  Provider:   Nobie Putnam, MD    Christena Deem- Long Term Monitor Instructions  Your physician has requested you wear a ZIO patch monitor for 14 days.  This is a single patch monitor. [employer] supplies one patch monitor per enrollment. Additional stickers are not available. Please do not apply patch if you will be having a Nuclear Stress Test,  Echocardiogram, Cardiac CT, MRI, or Chest Xray during the period you would be wearing the  monitor. The patch cannot be worn during these tests. You cannot remove and re-apply the  ZIO XT patch monitor.  Your ZIO patch monitor will be mailed 3 day USPS to  your address on file. It may take 3-5 days  to receive your monitor after you have been enrolled.  Once you have received your monitor, please review the enclosed instructions. Your monitor  has already been registered assigning a specific monitor serial # to you.  Billing and Patient Assistance Program Information  We have supplied [employer] with any of your insurance information on file for billing purposes. [employer] offers a sliding scale Patient Assistance Program for patients that do not have  insurance, or whose insurance does not completely cover the cost of the ZIO monitor.  You must apply for the Patient Assistance Program to qualify for this discounted rate.  To apply, please call [employer] at [phone], select option 4, select option 2, ask to apply for  Patient Assistance Program. Meredeth Ide will ask your household income, and how many people  are in your household. They will quote your out-of-pocket cost based on that information.  [employer] will also be able to set up a 54-month, interest-free payment plan if needed.  Applying the monitor   Shave hair from upper left chest.  Hold abrader disc by orange tab. Rub abrader in 40 strokes over the upper left chest as  indicated in your monitor instructions.  Clean area with 4 enclosed alcohol pads. Let dry.  Apply patch as indicated in monitor instructions. Patch will be placed under collarbone on left  side of chest with arrow pointing upward.  Rub patch adhesive wings for 2 minutes. Remove white label marked "1". Remove the white  label marked "2". Rub patch adhesive wings for  2 additional minutes.  While looking in a mirror, press and release button in center of patch. A small green light will  flash 3-4 times. This will be your only indicator that the monitor has been turned on.  Do not shower for the first 24 hours. You may shower after the first 24 hours.  Press the button if you feel a symptom. You will hear a small click. Record  Date, Time and  Symptom in the Patient Logbook.  When you are ready to remove the patch, follow instructions on the last 2 pages of Patient  Logbook. Stick patch monitor onto the last page of Patient Logbook.  Place Patient Logbook in the blue and white box. Use locking tab on box and tape box closed  securely. The blue and white box has prepaid postage on it. Please place it in the mailbox as  soon as possible. Your physician should have your test results approximately 7 days after the  monitor has been mailed back to [facility].  Call [facility] Customer Care at [phone] if you have questions regarding  your ZIO XT patch monitor. Call them immediately if you see an orange light blinking on your  monitor.  If your monitor falls off in less than 4 days, contact our Monitor department at [phone].  If your monitor becomes loose or falls off after 4 days call [employer] at [phone] for  suggestions on securing your monitor

## 2023-06-22 NOTE — Progress Notes (Unsigned)
Enrolled patient for a 14 day Zio XT  monitor to be mailed to patients home  °

## 2023-06-25 DIAGNOSIS — I480 Paroxysmal atrial fibrillation: Secondary | ICD-10-CM

## 2023-07-07 ENCOUNTER — Encounter (HOSPITAL_COMMUNITY): Payer: Self-pay

## 2023-07-07 ENCOUNTER — Telehealth (HOSPITAL_COMMUNITY): Payer: Self-pay | Admitting: Emergency Medicine

## 2023-07-07 DIAGNOSIS — I471 Supraventricular tachycardia, unspecified: Secondary | ICD-10-CM

## 2023-07-07 NOTE — Telephone Encounter (Signed)
Signed.

## 2023-07-10 ENCOUNTER — Telehealth (HOSPITAL_COMMUNITY): Payer: Self-pay | Admitting: *Deleted

## 2023-07-10 NOTE — Telephone Encounter (Signed)
Reaching out to patient to offer assistance regarding upcoming cardiac imaging study; pt verbalizes understanding of appt date/time, parking situation and where to check in, pre-test NPO status  and verified current allergies; name and call back number provided for further questions should they arise  Shaneeka Scarboro RN Navigator Cardiac Imaging [facility] [phone] office [phone] cell  Patient aware to avoid caffeine 12 hours prior to her cardiac PET scan. 

## 2023-07-11 ENCOUNTER — Telehealth: Payer: Self-pay | Admitting: Cardiology

## 2023-07-11 ENCOUNTER — Encounter (HOSPITAL_COMMUNITY)
Admission: RE | Admit: 2023-07-11 | Discharge: 2023-07-11 | Disposition: A | Discharge status: Home / Self Care | Payer: Medicare Other | Source: Ambulatory Visit | Attending: Cardiology | Admitting: Cardiology

## 2023-07-11 DIAGNOSIS — I480 Paroxysmal atrial fibrillation: Secondary | ICD-10-CM | POA: Diagnosis not present

## 2023-07-11 DIAGNOSIS — R911 Solitary pulmonary nodule: Secondary | ICD-10-CM

## 2023-07-11 DIAGNOSIS — I2510 Atherosclerotic heart disease of native coronary artery without angina pectoris: Secondary | ICD-10-CM | POA: Diagnosis not present

## 2023-07-11 LAB — NM PET CT CARDIAC PERFUSION MULTI W/ABSOLUTE BLOODFLOW
LV dias vol: 47 mL (ref 46–106)
LV sys vol: 29 mL
MBFR: 2.25
Peak HR: 129 {beats}/min
Rest HR: 108 {beats}/min
Rest MBF: 1.16 ml/g/min
Rest Nuclear Isotope Dose: 15.3 mCi
ST Depression (mm): 0 mm
Stress MBF: 2.61 ml/g/min
Stress Nuclear Isotope Dose: 15.2 mCi

## 2023-07-11 MED ORDER — CAFFEINE CITRATE BASE COMPONENT 10 MG/ML IV SOLN
INTRAVENOUS | Status: AC
  Filled 2023-07-11: qty 3

## 2023-07-11 MED ORDER — RUBIDIUM RB82 GENERATOR (RUBYFILL)
15.5000 | PACK | Freq: Once | INTRAVENOUS | Status: AC
  Administered 2023-07-11: 15.3 via INTRAVENOUS

## 2023-07-11 MED ORDER — REGADENOSON 0.4 MG/5ML IV SOLN: 0.4000 mg | Freq: Once | INTRAVENOUS | Status: DC

## 2023-07-11 MED ORDER — ATENOLOL 50 MG PO TABS
50.0000 mg | ORAL_TABLET | ORAL | Status: AC
  Administered 2023-07-11: 50 mg via ORAL
  Filled 2023-07-11: qty 1

## 2023-07-11 MED ORDER — RUBIDIUM RB82 GENERATOR (RUBYFILL)
15.5000 | PACK | Freq: Once | INTRAVENOUS | Status: AC
  Administered 2023-07-11: 15.2 via INTRAVENOUS

## 2023-07-11 MED ORDER — REGADENOSON 0.4 MG/5ML IV SOLN
INTRAVENOUS | Status: AC
Start: 2023-07-11 — End: ?
  Filled 2023-07-11: qty 5

## 2023-07-11 MED ORDER — DEXTROSE 5 % IV SOLN
INTRAVENOUS | Status: AC
Start: 2023-07-11 — End: ?
  Filled 2023-07-11: qty 50

## 2023-07-11 NOTE — Telephone Encounter (Signed)
[facility] radiology called and Mora Bellman stated she would like to make sure we had access to PET CT because it needs provider attention.

## 2023-07-11 NOTE — Telephone Encounter (Signed)
Calling with Nuclear Med results. Please advise  Call transferred

## 2023-07-12 NOTE — Telephone Encounter (Signed)
Left message for patient to call back  

## 2023-07-12 NOTE — Telephone Encounter (Signed)
Pt was returning nurse [place] call and is requesting a callback. Please advise

## 2023-07-12 NOTE — Telephone Encounter (Signed)
Spoke with patient and advised on lung nodule clinic follow up. Patient verbalized understanding. Patient states that she has already sent back her heart monitor. She did have an episode yesterday of palpitations and lightheadedness. She states that this is the first episode that she has had since Dr. Jimmey Ralph increased her atenolol. She reports that her heart rate and blood pressure were normal. She had sent back the monitor the day prior and reports not having any symptoms while she was wearing it. I moved up her appointment with Dr. Jimmey Ralph so that she can discuss these symptoms and results from her heart monitor sooner. She will let us know if she has any further symptoms or concerns in the meantime.

## 2023-07-15 DIAGNOSIS — I480 Paroxysmal atrial fibrillation: Secondary | ICD-10-CM | POA: Diagnosis not present

## 2023-08-02 DIAGNOSIS — H524 Presbyopia: Secondary | ICD-10-CM | POA: Diagnosis not present

## 2023-08-07 NOTE — Progress Notes (Unsigned)
Electrophysiology Office Note:   Date:  08/08/2023  ID:  Katelyn Moore, DOB 1941-03-11, MRN [id number]  Primary Cardiologist: Nanetta Batty, MD Electrophysiologist: Nobie Putnam, MD      History of Present Illness:   Katelyn Moore is a 82 y.o. female with h/o hypertension, mixed hypercholesterolemia, nonobstructive CAD, and paroxysmal atrial fibrillation who is seen today for routine electrophysiology followup.   Since last being seen in our clinic the patient reports doing relatively well.  She continues to have intermittent episodes of palpitations.  Often these are short lasting only seconds, but on occasion she does have longer episodes lasting for a minute which are associated with dizziness and lightheadedness.  She did not notice a change in frequency or duration of episodes after increasing her atenolol.  She is able to do all of her usual activities and is active.  She denies chest pain, dyspnea, PND, orthopnea, nausea, vomiting, dizziness, syncope, edema, weight gain, or early satiety.  She is concerned about the presence of a lung nodule that was incidentally found on CT scan during her PET stress. Review of systems complete and found to be negative unless listed in HPI.   EP Information / Studies Reviewed:    EKG is not ordered today. EKG from 06/22/23 reviewed which showed sinus bradycardia with PACs     Zio 07/18/23:  HR 42 - 214, average 66 bpm. 1 nonsustained VT (longest 5 beats) No atrial fibrillation detected. Frequent supraventricular ectopy, 22.6%. Rare ventricular ectopy. Very frequent episodes of sustained SVT, longest lasting 40 seconds with an average rate of 151 bpm.  No symptom trigger episodes.    PET 07/11/23:  No evidence of ischemia or infarction.  Echocardiogram 05/19/2023: Normal LV size and function.  LVEF 55-60%. No wall motion abnormalities.  Grade 1 diastolic dysfunction. Normal RV size and function. Mildly dilated left atrium.   Right atrium normal in size. Mild aortic regurgitation.  No other valvular disease mentioned.   Coronary CTA 04/28/2022: 50 to 69% ostial stenosis of a small, nondominant RCA.  30 to 50% stenosis of the proximal LAD.  Risk Assessment/Calculations:    CHA2DS2-VASc Score = 4   This indicates a 4.8% annual risk of stroke. The patient's score is based upon: CHF History: 0 HTN History: 1 Diabetes History: 0 Stroke History: 0 Vascular Disease History: 0 Age Score: 2 Gender Score: 1             Physical Exam:   VS:  BP 138/70   Pulse (!) 54   Ht 5' (1.524 m)   Wt 130 lb 6.4 oz (59.1 kg)   SpO2 98%   BMI 25.47 kg/m    Wt Readings from Last 3 Encounters:  08/08/23 130 lb 6.4 oz (59.1 kg)  06/22/23 129 lb (58.5 kg)  05/03/23 132 lb 6.4 oz (60.1 kg)     GEN: Well nourished, well developed in no acute distress NECK: No JVD; No carotid bruits CARDIAC: Normal rate and regular rhythm RESPIRATORY:  Clear to auscultation without rales, wheezing or rhonchi  ABDOMEN: Soft, non-tender, non-distended EXTREMITIES:  No edema; No deformity   ASSESSMENT AND PLAN:   Katelyn Moore is a 82 y.o. 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  Patient recently wore a ZIO monitor and had multiple episodes of SVT.  During her last visit her atenolol was increased to 75  mg daily and Zio monitor was repeated, which did not show marked improvement in frequency or duration of symptoms.  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  She also appears to have short bursts of atrial fibrillation and/or pulmonary vein A. tach.   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 - Decrease atenolol back to 50 mg/day  and add dronedarone 400 mg twice daily. - Patient would like to try antiarrhythmic drug therapy prior to pursuing ablation.   #Paroxsymal atrial fibrillation: ZIO monitor also appears to show short bursts of atrial fibrillation or likely PV A. tach - Start dronedarone. - Decrease atenolol to 50 mg/day as above.    #Secondary hypercoagulable state due to atrial fibrillation: -Continue Eliquis 2.5 mg twice daily for CHA2DS2-VASc of score of 4.  #Lung nodule: 7 mm lung nodule found incidentally on CT scan during her PET stress. -Referral to lung nodule clinic.   Follow up with Dr. Jimmey Ralph  in 4 months.   Signed, Nobie Putnam, MD

## 2023-08-08 ENCOUNTER — Encounter: Payer: Self-pay | Admitting: Cardiology

## 2023-08-08 ENCOUNTER — Ambulatory Visit: Payer: Medicare Other | Attending: Cardiology | Admitting: Cardiology

## 2023-08-08 VITALS — BP 138/70 | HR 54 | Ht 60.0 in | Wt 130.4 lb

## 2023-08-08 DIAGNOSIS — D6869 Other thrombophilia: Secondary | ICD-10-CM | POA: Diagnosis not present

## 2023-08-08 DIAGNOSIS — I471 Supraventricular tachycardia, unspecified: Secondary | ICD-10-CM | POA: Diagnosis not present

## 2023-08-08 DIAGNOSIS — I480 Paroxysmal atrial fibrillation: Secondary | ICD-10-CM | POA: Diagnosis not present

## 2023-08-08 DIAGNOSIS — R911 Solitary pulmonary nodule: Secondary | ICD-10-CM | POA: Diagnosis not present

## 2023-08-08 MED ORDER — DRONEDARONE HCL 400 MG PO TABS: 400.0000 mg | ORAL_TABLET | Freq: Two times a day (BID) | ORAL | 3 refills | Status: DC

## 2023-08-08 MED ORDER — ATENOLOL 50 MG PO TABS: 50.0000 mg | ORAL_TABLET | Freq: Every day | ORAL | 3 refills | Status: AC

## 2023-08-08 NOTE — Patient Instructions (Addendum)
Medication Instructions:  Your physician has recommended you make the following change in your medication:  1) DECREASE atenolol to 50 mg daily  2) START taking Multaq (dronederone) 400 mg twice daily   *If you need a refill on your cardiac medications before your next appointment, please call your pharmacy*  Follow-Up: At [facility], you and your health needs are our priority.  As part of our continuing mission to provide you with exceptional heart care, we have created designated Provider Care Teams.  These Care Teams include your primary Cardiologist (physician) and Advanced Practice Providers (APPs -  Physician Assistants and Nurse Practitioners) who all work together to provide you with the care you need, when you need it.  Your next appointment:   4 months  Provider:   You may see Nobie Putnam, MD or one of the following Advanced Practice Providers on your designated Care Team:   Francis Dowse, [place] "Mardelle Matte" [place], [place] Sherie Don, NP Canary Brim, NP    Other Instructions  You have been referred to Pulmonary nodule clinic

## 2023-08-15 DIAGNOSIS — R7303 Prediabetes: Secondary | ICD-10-CM | POA: Diagnosis not present

## 2023-08-15 DIAGNOSIS — Z0000 Encounter for general adult medical examination without abnormal findings: Secondary | ICD-10-CM | POA: Diagnosis not present

## 2023-08-15 DIAGNOSIS — E782 Mixed hyperlipidemia: Secondary | ICD-10-CM | POA: Diagnosis not present

## 2023-08-15 DIAGNOSIS — I10 Essential (primary) hypertension: Secondary | ICD-10-CM | POA: Diagnosis not present

## 2023-08-15 DIAGNOSIS — I480 Paroxysmal atrial fibrillation: Secondary | ICD-10-CM | POA: Diagnosis not present

## 2023-08-15 DIAGNOSIS — C439 Malignant melanoma of skin, unspecified: Secondary | ICD-10-CM | POA: Diagnosis not present

## 2023-08-15 DIAGNOSIS — I700 Atherosclerosis of aorta: Secondary | ICD-10-CM | POA: Diagnosis not present

## 2023-08-15 DIAGNOSIS — D6869 Other thrombophilia: Secondary | ICD-10-CM | POA: Diagnosis not present

## 2023-08-29 DIAGNOSIS — Z85820 Personal history of malignant melanoma of skin: Secondary | ICD-10-CM | POA: Diagnosis not present

## 2023-08-29 DIAGNOSIS — L821 Other seborrheic keratosis: Secondary | ICD-10-CM | POA: Diagnosis not present

## 2023-08-29 DIAGNOSIS — Z85828 Personal history of other malignant neoplasm of skin: Secondary | ICD-10-CM | POA: Diagnosis not present

## 2023-08-29 DIAGNOSIS — L814 Other melanin hyperpigmentation: Secondary | ICD-10-CM | POA: Diagnosis not present

## 2023-09-27 ENCOUNTER — Other Ambulatory Visit: Payer: Self-pay | Admitting: Physician Assistant

## 2023-09-28 ENCOUNTER — Encounter: Payer: Self-pay | Admitting: *Deleted

## 2023-09-29 ENCOUNTER — Ambulatory Visit: Payer: Medicare Other | Admitting: Cardiology

## 2023-10-08 ENCOUNTER — Other Ambulatory Visit: Payer: Self-pay | Admitting: Cardiovascular Disease

## 2023-10-08 DIAGNOSIS — I480 Paroxysmal atrial fibrillation: Secondary | ICD-10-CM

## 2023-10-09 NOTE — Telephone Encounter (Signed)
Prescription refill request for Eliquis received. Indication:afib Last office visit:11/24 Scr:0.83 Age: 83 Weight:59.1  kg  Prescription refilled

## 2023-10-18 ENCOUNTER — Ambulatory Visit: Payer: Medicare Other | Attending: Cardiovascular Disease | Admitting: Cardiovascular Disease

## 2023-10-18 ENCOUNTER — Encounter: Payer: Self-pay | Admitting: Cardiovascular Disease

## 2023-10-18 VITALS — BP 128/68 | HR 50 | Ht 60.0 in | Wt 134.0 lb

## 2023-10-18 DIAGNOSIS — I480 Paroxysmal atrial fibrillation: Secondary | ICD-10-CM | POA: Diagnosis not present

## 2023-10-18 DIAGNOSIS — R002 Palpitations: Secondary | ICD-10-CM | POA: Diagnosis not present

## 2023-10-18 DIAGNOSIS — R911 Solitary pulmonary nodule: Secondary | ICD-10-CM | POA: Insufficient documentation

## 2023-10-18 DIAGNOSIS — E782 Mixed hyperlipidemia: Secondary | ICD-10-CM

## 2023-10-18 DIAGNOSIS — I10 Essential (primary) hypertension: Secondary | ICD-10-CM

## 2023-10-18 NOTE — Progress Notes (Signed)
10/18/2023 Katelyn Moore   11/03/1940  [id number]  Primary Physician Pahwani, Kasandra Knudsen, MD Primary Cardiologist: Runell Gess MD Nicholes Calamity, [place]  HPI:  Katelyn Moore is a 83 y.o.    thin-appearing married Caucasian female mother of 2 children, grandmother of 1 grandchild who is a retired Tourist information centre manager. I last saw her in the office 10//09/27/2022.  She ruled out for myocardial infarction. She had a coronary CTA that showed a coronary calcium score of 5 with moderate disease in a nondominant RCA but otherwise no obstructive disease. 2D echo showed normal LV function with mild to moderate AI. She does have a history of hypertension on chlorthalidone and atenolol. She was started on rosuvastatin for mildly elevated LDL given her CAD. Her PCP weaned her off her beta-blocker and she was seen on 06/17/2022 with tachypalpitations in the ER with runs of A-fib with RVR. She was placed back on a beta-blocker and has had no further episodes. As result, she was begun on Eliquis oral anticoagulation.   Since I saw her in the office a year ago I did refer her to Dr. Jimmey Ralph, EP, who evaluated her.  She did have a cardiac PET study that was low risk and nonischemic but did note a 7 mm right upper lobe nodule which will need to be followed up by her PCP.  An event monitor showed frequent PACs, rare PVCs and short runs of SVT.  She was placed on dronedarone with marked improvement in her palpitations.  She is now working out at [employer] in [place]  She is from intents purposes asymptomatic.   Current Meds  Medication Sig   atenolol (TENORMIN) 50 MG tablet Take 1 tablet (50 mg total) by mouth daily.   Calcium Carb-Cholecalciferol (CALCIUM + D3 PO) Take 600 mg by mouth in the morning and at bedtime.   chlorthalidone (HYGROTON) 25 MG tablet Take 1 tablet (25 mg total) by mouth daily.   ciprofloxacin (CILOXAN) 0.3 % ophthalmic solution daily. For nails   dronedarone (MULTAQ) 400 MG  tablet Take 1 tablet (400 mg total) by mouth 2 (two) times daily with a meal.   ELIQUIS 2.5 MG TABS tablet TAKE 1 TABLET(2.5 MG) BY MOUTH TWICE DAILY   magnesium 30 MG tablet Take 200 mg by mouth 2 (two) times daily.   Omega-3 Fatty Acids (FISH OIL PO) Take 1,000 mg by mouth once.   omeprazole (PRILOSEC) 20 MG capsule Take 1 capsule (20 mg total) by mouth 2 (two) times daily before a meal.   rosuvastatin (CRESTOR) 20 MG tablet Take 1 tablet (20 mg total) by mouth daily.   triamcinolone cream (KENALOG) 0.1 % Apply 1 Application topically 2 (two) times daily.   TURMERIC PO Take by mouth.   Ubrogepant (UBRELVY) 100 MG TABS TAKE 1 TABLET BY MOUTH AS NEEDED FOR HEADACHE   vitamin B-12 (CYANOCOBALAMIN) 100 MCG tablet Take 100 mcg by mouth daily.     Allergies  Allergen Reactions   Codeine Other (See Comments)    headaches   Tape Other (See Comments)    Blisters from tape from  Breast biopsy   Morphine And Codeine Rash    Social History   Socioeconomic History   Marital status: Married    Spouse name: Not on file   Number of children: Not on file   Years of education: Not on file   Highest education level: Not on file  Occupational History   Not  on file  Tobacco Use   Smoking status: Never   Smokeless tobacco: Never  Vaping Use   Vaping status: Never Used  Substance and Sexual Activity   Alcohol use: Yes    Comment: Drinks rarely probably  1 glass of wine a year   Drug use: No   Sexual activity: Not on file  Other Topics Concern   Not on file  Social History Narrative   Not on file   Social Drivers of Health   Financial Resource Strain: Not on file  Food Insecurity: Not on file  Transportation Needs: Not on file  Physical Activity: Not on file  Stress: Not on file  Social Connections: Not on file  Intimate Partner Violence: Not on file     Review of Systems: General: negative for chills, fever, night sweats or weight changes.  Cardiovascular: negative for chest  pain, dyspnea on exertion, edema, orthopnea, palpitations, paroxysmal nocturnal dyspnea or shortness of breath Dermatological: negative for rash Respiratory: negative for cough or wheezing Urologic: negative for hematuria Abdominal: negative for nausea, vomiting, diarrhea, bright red blood per rectum, melena, or hematemesis Neurologic: negative for visual changes, syncope, or dizziness All other systems reviewed and are otherwise negative except as noted above.    Blood pressure 128/68, pulse (!) 50, height 5' (1.524 m), weight 134 lb (60.8 kg), SpO2 94%.  General appearance: alert and no distress Neck: no adenopathy, no carotid bruit, no JVD, supple, symmetrical, trachea midline, and thyroid not enlarged, symmetric, no tenderness/mass/nodules Lungs: clear to auscultation bilaterally Heart: regular rate and rhythm, S1, S2 normal, no murmur, click, rub or gallop Extremities: extremities normal, atraumatic, no cyanosis or edema Pulses: 2+ and symmetric Skin: Skin color, texture, turgor normal. No rashes or lesions Neurologic: Grossly normal  EKG not performed today      ASSESSMENT AND PLAN:   Essential hypertension History of essential hypertension her blood pressure measured today at 128/68.  She is on atenolol.  Mixed hyperlipidemia History of hyperlipidemia on statin therapy with lipid profile performed 08/15/2023 revealing total cholesterol 125, LDL 52 and HDL 55.  PAF (paroxysmal atrial fibrillation) (HCC) History of PAF maintaining sinus rhythm on Eliquis oral anticoagulation.  Palpitations History of palpitations with event monitor ordered by Dr. Jimmey Ralph 06/22/2023 that showed frequent PACs, rare PVCs and short runs of SVT which she was symptomatic from.  She was on beta-blocker.  There was a discussion with regard to ablation versus antiarrhythmic therapy which she opted for.  She was placed on dronedarone which has been amazingly effective.  She is currently  asymptomatic.  Pulmonary nodule 7 mm pulmonary nodule right upper lobe found on cardiac PET performed 07/11/2023.  This will be followed up with a noncontrast chest CT in 1 year.     Runell Gess MD FACP,FACC,FAHA, [facility] 10/18/2023 12:01 PM

## 2023-10-18 NOTE — Assessment & Plan Note (Signed)
History of essential hypertension her blood pressure measured today at 128/68.  She is on atenolol.

## 2023-10-18 NOTE — Assessment & Plan Note (Signed)
7 mm pulmonary nodule right upper lobe found on cardiac PET performed 07/11/2023.  This will be followed up with a noncontrast chest CT in 1 year.

## 2023-10-18 NOTE — Assessment & Plan Note (Signed)
History of palpitations with event monitor ordered by Dr. Jimmey Ralph 06/22/2023 that showed frequent PACs, rare PVCs and short runs of SVT which she was symptomatic from.  She was on beta-blocker.  There was a discussion with regard to ablation versus antiarrhythmic therapy which she opted for.  She was placed on dronedarone which has been amazingly effective.  She is currently asymptomatic.

## 2023-10-18 NOTE — Assessment & Plan Note (Signed)
History of hyperlipidemia on statin therapy with lipid profile performed 08/15/2023 revealing total cholesterol 125, LDL 52 and HDL 55.

## 2023-10-18 NOTE — Assessment & Plan Note (Signed)
History of PAF maintaining sinus rhythm on Eliquis oral anticoagulation.

## 2023-10-18 NOTE — Patient Instructions (Signed)
    Testing/Procedures:  CT WO CONTRAST IN ONE YEAR WITH DR [facility]   Follow-Up: At [facility], you and your health needs are our priority.  As part of our continuing mission to provide you with exceptional heart care, we have created designated Provider Care Teams.  These Care Teams include your primary Cardiologist (physician) and Advanced Practice Providers (APPs -  Physician Assistants and Nurse Practitioners) who all work together to provide you with the care you need, when you need it.  We recommend signing up for the patient portal called "MyChart".  Sign up information is provided on this After Visit Summary.  MyChart is used to connect with patients for Virtual Visits (Telemedicine).  Patients are able to view lab/test results, encounter notes, upcoming appointments, etc.  Non-urgent messages can be sent to your provider as well.   To learn more about what you can do with MyChart, go to [email or web address].    Your next appointment:   12 month(s)  Provider:   Nanetta Batty, MD

## 2023-10-27 ENCOUNTER — Encounter: Payer: Self-pay | Admitting: Physician Assistant

## 2023-10-27 ENCOUNTER — Ambulatory Visit: Payer: Medicare Other | Admitting: Physician Assistant

## 2023-10-27 VITALS — BP 131/66 | HR 54 | Wt 132.0 lb

## 2023-10-27 DIAGNOSIS — G43009 Migraine without aura, not intractable, without status migrainosus: Secondary | ICD-10-CM | POA: Diagnosis not present

## 2023-10-27 MED ORDER — UBRELVY 100 MG PO TABS: 10.0000 mg | ORAL_TABLET | ORAL | 11 refills | Status: AC | PRN

## 2023-10-27 NOTE — Progress Notes (Signed)
 Pt notes doing well. Notes sometimes waking up with a HA  and not sure if weather is trigging them.  History:  Katelyn Moore is a 83 y.o. No obstetric history on file. who presents to clinic today for annual eval of headache.  She has not had significant changes in headaches over this past year.  She has not used fioricet.  The ubrelvy  has always worked although it sometimes takes a while.  She still wakes up with many of the worst headaches.  She often delays taking the medication despite no side effects.  She has had some cardiac issues this past year and is no longer using maxalt  as a result.  She notes no other medication has worked as well as maxalt .  She continues to exercise regularly with a trainer in a program for folks in her age group.  She continues to be active in a bridge club or 2. She has a husband of nearly 71 years, age 53, living at home and also able to participate in the physical training.  She also has a son that lives with them, and recently diagnosed with parkinson's in addition to his known paranoid schizophrenia.  She does have stress related to this.    Number of days in the last 4 weeks with:  Severe headache: 0 Moderate headache: 0 Mild headache: 6 No headache: 22   Past Medical History:  Diagnosis Date   Cancer (HCC)    Skin cancer - basal cell removed   Headache(784.0)    Hypertension    Melanoma (HCC)     Social History   Socioeconomic History   Marital status: Married    Spouse name: Not on file   Number of children: Not on file   Years of education: Not on file   Highest education level: Not on file  Occupational History   Not on file  Tobacco Use   Smoking status: Never   Smokeless tobacco: Never  Vaping Use   Vaping status: Never Used  Substance and Sexual Activity   Alcohol use: Yes    Comment: Drinks rarely probably  1 glass of wine a year   Drug use: No   Sexual activity: Not on file  Other Topics Concern   Not on file  Social  History Narrative   Not on file   Social Drivers of Health   Financial Resource Strain: Not on file  Food Insecurity: Not on file  Transportation Needs: Not on file  Physical Activity: Not on file  Stress: Not on file  Social Connections: Not on file  Intimate Partner Violence: Not on file    No family history on file.  Allergies  Allergen Reactions   Codeine Other (See Comments)    headaches   Tape Other (See Comments)    Blisters from tape from  Breast biopsy   Morphine And Codeine Rash    Current Outpatient Medications on File Prior to Visit  Medication Sig Dispense Refill   atenolol  (TENORMIN ) 50 MG tablet Take 1 tablet (50 mg total) by mouth daily. 90 tablet 3   Calcium  Carb-Cholecalciferol (CALCIUM  + D3 PO) Take 600 mg by mouth in the morning and at bedtime.     chlorthalidone  (HYGROTON ) 25 MG tablet Take 1 tablet (25 mg total) by mouth daily. 90 tablet 3   ciprofloxacin (CILOXAN) 0.3 % ophthalmic solution daily. For nails     dronedarone  (MULTAQ ) 400 MG tablet Take 1 tablet (400 mg total) by mouth 2 (  two) times daily with a meal. 180 tablet 3   ELIQUIS  2.5 MG TABS tablet TAKE 1 TABLET(2.5 MG) BY MOUTH TWICE DAILY 180 tablet 3   magnesium  30 MG tablet Take 200 mg by mouth 2 (two) times daily.     omeprazole  (PRILOSEC) 20 MG capsule Take 1 capsule (20 mg total) by mouth 2 (two) times daily before a meal. 60 capsule 0   rosuvastatin  (CRESTOR ) 20 MG tablet Take 1 tablet (20 mg total) by mouth daily. 30 tablet 3   triamcinolone cream (KENALOG) 0.1 % Apply 1 Application topically 2 (two) times daily.     TURMERIC PO Take by mouth.     Ubrogepant  (UBRELVY ) 100 MG TABS TAKE 1 TABLET BY MOUTH AS NEEDED FOR HEADACHE 10 tablet 0   vitamin B-12 (CYANOCOBALAMIN) 100 MCG tablet Take 100 mcg by mouth daily.     Omega-3 Fatty Acids (FISH OIL PO) Take 1,000 mg by mouth once. (Patient not taking: Reported on 10/27/2023)     No current facility-administered medications on file prior to  visit.     Review of Systems:  All pertinent positive/negative included in HPI, all other review of systems are negative   Objective:  Physical Exam BP 131/66   Pulse (!) 54   Wt 132 lb (59.9 kg)   BMI 25.78 kg/m  CONSTITUTIONAL: Well-developed, well-nourished female in no acute distress.  EYES: EOM intact ENT: Normocephalic CARDIOVASCULAR: Regular rate and rhythm with no adventitious sounds. RESPIRATORY: Normal rate.  MUSCULOSKELETAL: Normal ROM SKIN: Warm, dry without erythema  NEUROLOGICAL: Alert, oriented, CN II-XII grossly intact, Appropriate balance PSYCH: Normal behavior, mood   Assessment & Plan:  Assessment: 1. Migraine without aura and without status migrainosus, not intractable      Plan: Continue Ubrelvy  prn for now.  Samples provided for nurtec/zavzpret.  If she finds that one of those are better, she may request new rx for that medication in place of ubrelvy .  She is applauded for continue to exercise and prioritize her own health and well-being! Follow-up in 12 months or sooner PRN  61 minutes spent in direct contact with patient this encounter.   Nance Gretta Darice FORBES, PA-C 10/27/2023 9:35 AM

## 2023-11-16 DIAGNOSIS — H18413 Arcus senilis, bilateral: Secondary | ICD-10-CM | POA: Diagnosis not present

## 2023-11-16 DIAGNOSIS — H25013 Cortical age-related cataract, bilateral: Secondary | ICD-10-CM | POA: Diagnosis not present

## 2023-11-16 DIAGNOSIS — H25043 Posterior subcapsular polar age-related cataract, bilateral: Secondary | ICD-10-CM | POA: Diagnosis not present

## 2023-11-16 DIAGNOSIS — H2511 Age-related nuclear cataract, right eye: Secondary | ICD-10-CM | POA: Diagnosis not present

## 2023-11-16 DIAGNOSIS — H2513 Age-related nuclear cataract, bilateral: Secondary | ICD-10-CM | POA: Diagnosis not present

## 2023-11-17 ENCOUNTER — Telehealth: Payer: Self-pay | Admitting: *Deleted

## 2023-11-17 NOTE — Telephone Encounter (Signed)
   Pre-operative Risk Assessment    Patient Name: Katelyn Moore  DOB: 30-Jul-1941 MRN: [id number]   Date of last office visit: 10/18/23 DR. BERRY Date of next office visit: 11/22/23 DR. PARKER    Request for Surgical Clearance    Procedure:   CATARACT EXTRACTION WITH INTRAOCULAR LENS IMPLANT OF THE RIGHT EYE 01/10/24;THE LEFT EYE TO FOLLOW 01/29/24.  Date of Surgery:  Clearance 01/10/24                                Surgeon:  DR. Vonna Kotyk Surgeon's Group or Practice Name:  [facility] Phone number:  [phone] Fax number:  [phone] ATTN: CHERI   Type of Clearance Requested:   - Medical ; PER CLEARANCE FORM NO MEDICATIONS NEED TO BE HELD INCLUDING BLOOD THINNERS   Type of Anesthesia:   TOPICAL WITH IV MEDICATION   Additional requests/questions:    Elpidio Anis   11/17/2023, 9:00 AM

## 2023-11-17 NOTE — Telephone Encounter (Signed)
   Patient Name: Katelyn Moore  DOB: [birth year] MRN: [id number]  Primary Cardiologist: Nanetta Batty, MD  Chart reviewed as part of pre-operative protocol coverage. Cataract extractions are recognized in guidelines as low risk surgeries that do not typically require specific preoperative testing or holding of blood thinner therapy. Therefore, given past medical history and time since last visit, based on ACC/AHA guidelines, ADHYA COCCO would be at acceptable risk for the planned procedure without further cardiovascular testing.   I will route this recommendation to the requesting party via Epic fax function and remove from pre-op pool.  Please call with questions.  Joylene Grapes, NP 11/17/2023, 9:18 AM

## 2023-11-22 ENCOUNTER — Encounter: Payer: Self-pay | Admitting: Cardiology

## 2023-11-22 ENCOUNTER — Ambulatory Visit: Payer: Medicare Other | Attending: Cardiology | Admitting: Cardiology

## 2023-11-22 VITALS — BP 122/72 | HR 49 | Ht 60.0 in | Wt 136.8 lb

## 2023-11-22 DIAGNOSIS — R001 Bradycardia, unspecified: Secondary | ICD-10-CM

## 2023-11-22 DIAGNOSIS — I480 Paroxysmal atrial fibrillation: Secondary | ICD-10-CM

## 2023-11-22 DIAGNOSIS — Z79899 Other long term (current) drug therapy: Secondary | ICD-10-CM

## 2023-11-22 DIAGNOSIS — I471 Supraventricular tachycardia, unspecified: Secondary | ICD-10-CM | POA: Diagnosis not present

## 2023-11-22 DIAGNOSIS — D6869 Other thrombophilia: Secondary | ICD-10-CM

## 2023-11-22 NOTE — Progress Notes (Signed)
 Electrophysiology Office Note:   Date:  11/22/2023  ID:  Katelyn Moore, DOB 12/30/1940, MRN [id number]  Primary Cardiologist: Nanetta Batty, MD Electrophysiologist: Nobie Putnam, MD      History of Present Illness:   Katelyn Moore is a 83 y.o. female with h/o hypertension, mixed hypercholesterolemia, nonobstructive CAD, and paroxysmal atrial fibrillation who is seen today for routine electrophysiology followup.   Discussed the use of AI scribe software for clinical note transcription with the patient, who gave verbal consent to proceed.  History of Present Illness          Review of systems complete and found to be negative unless listed in HPI.   EP Information / Studies Reviewed:    EKG is ordered today. Personal review as below.  EKG Interpretation Date/Time:  Wednesday November 22 2023 10:54:23 EST Ventricular Rate:  49 PR Interval:  166 QRS Duration:  88 QT Interval:  478 QTC Calculation: 431 R Axis:   50  Text Interpretation: Sinus bradycardia Nonspecific ST and T wave abnormality When compared with ECG of 22-Jun-2023 09:42, Premature atrial complexes are no longer Present Confirmed by Nobie Putnam [phone]) on 11/22/2023 11:14:39 PM   Zio 07/18/23:  HR 42 - 214, average 66 bpm. 1 nonsustained VT (longest 5 beats) No atrial fibrillation detected. Frequent supraventricular ectopy, 22.6%. Rare ventricular ectopy. Very frequent episodes of sustained SVT, longest lasting 40 seconds with an average rate of 151 bpm.  No symptom trigger episodes.    PET 07/11/23:  No evidence of ischemia or infarction.  Echocardiogram 05/19/2023: Normal LV size and function.  LVEF 55-60%. No wall motion abnormalities.  Grade 1 diastolic dysfunction. Normal RV size and function. Mildly dilated left atrium.  Right atrium normal in size. Mild aortic regurgitation.  No other valvular disease mentioned.   Coronary CTA 04/28/2022: 50 to 69% ostial stenosis of a small, nondominant  RCA.  30 to 50% stenosis of the proximal LAD.  Risk Assessment/Calculations:    CHA2DS2-VASc Score = 4   This indicates a 4.8% annual risk of stroke. The patient's score is based upon: CHF History: 0 HTN History: 1 Diabetes History: 0 Stroke History: 0 Vascular Disease History: 0 Age Score: 2 Gender Score: 1            Physical Exam:   VS:  BP 122/72 (BP Location: Left Arm, Patient Position: Sitting, Cuff Size: Normal)   Pulse (!) 49   Ht 5' (1.524 m)   Wt 136 lb 12.8 oz (62.1 kg)   SpO2 97%   BMI 26.72 kg/m    Wt Readings from Last 3 Encounters:  11/22/23 136 lb 12.8 oz (62.1 kg)  10/27/23 132 lb (59.9 kg)  10/18/23 134 lb (60.8 kg)     GEN: Well nourished, well developed in no acute distress NECK: No JVD CARDIAC: Bradycardic and regular rhythm RESPIRATORY:  Clear to auscultation without rales, wheezing or rhonchi  ABDOMEN: Soft, non-distended EXTREMITIES:  No edema; No deformity   ASSESSMENT AND PLAN:   Katelyn Moore is an 83 y.o. female with h/o hypertension, mixed hypercholesterolemia, nonobstructive CAD, and paroxysmal atrial fibrillation. Patient wore a ZIO monitor and had multiple episodes of SVT.  Initially, atenolol was increased to 75 mg daily and Zio monitor was repeated, which did not show marked improvement in frequency or duration of symptoms.  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  She also appears to have short bursts  of atrial fibrillation and/or pulmonary vein A. tach. She was started on dronedarone with significant improvement in her symptoms.   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 - Continue dronedarone 400 mg twice daily. - Continue atenolol 50 mg/day. If she were to develop symptoms associated with bradycardia then decrease or stop atenolol. - Patient would like to continue with antiarrhythmic drug therapy  rather than pursuing ablation.   #Paroxsymal atrial fibrillation: ZIO monitor also appears to show short bursts of atrial fibrillation or likely PV A. Tach.  - Continue dronedarone 400 mg twice daily. - Continue atenolol 50 mg/day. If she were to develop symptoms associated with bradycardia then decrease or stop atenolol.   #Secondary hypercoagulable state due to atrial fibrillation: -Continue Eliquis 2.5 mg twice daily for CHA2DS2-VASc of score of 4.  #Asymptomatic resting sinus bradycardia: Reports chronotropic competence. -Continue to monitor. If she were to develop symptoms associated with bradycardia then decrease or stop atenolol.  #High risk medication use: Anti-arrhythmic drug therapy, dronedarone. -ECG with normal PR, QRS, and Qtc. She has asymptomatic resting sinus bradycardia with chronotropic competence. -She is will send lab work from PCP.   #Lung nodule: 7 mm lung nodule found incidentally on CT scan during her PET stress. -Referred to lung nodule clinic.   #Cataract surgery: -Can interrupt anti-coagulation as needed for surgery.   Follow up with EP APP in 6 months, Dr. Jimmey Ralph in 1 year.  Signed, Nobie Putnam, MD

## 2023-11-22 NOTE — Patient Instructions (Signed)
 Medication Instructions:  Your physician recommends that you continue on your current medications as directed. Please refer to the Current Medication list given to you today.  *If you need a refill on your cardiac medications before your next appointment, please call your pharmacy*  Follow-Up: At [facility], you and your health needs are our priority.  As part of our continuing mission to provide you with exceptional heart care, we have created designated Provider Care Teams.  These Care Teams include your primary Cardiologist (physician) and Advanced Practice Providers (APPs -  Physician Assistants and Nurse Practitioners) who all work together to provide you with the care you need, when you need it.  Your next appointment:   1 year  Provider:   You may see Nobie Putnam, MD or one of the following Advanced Practice Providers on your designated Care Team:   Francis Dowse, [place] [address] [place], [place] Sherie Don, NP Canary Brim, NP

## 2023-11-24 ENCOUNTER — Other Ambulatory Visit: Payer: Self-pay

## 2023-11-24 DIAGNOSIS — R911 Solitary pulmonary nodule: Secondary | ICD-10-CM

## 2023-12-04 ENCOUNTER — Ambulatory Visit: Payer: Medicare Other | Admitting: Pulmonary Disease

## 2024-01-10 DIAGNOSIS — H2511 Age-related nuclear cataract, right eye: Secondary | ICD-10-CM | POA: Diagnosis not present

## 2024-01-11 DIAGNOSIS — H2512 Age-related nuclear cataract, left eye: Secondary | ICD-10-CM | POA: Diagnosis not present

## 2024-01-24 DIAGNOSIS — M25362 Other instability, left knee: Secondary | ICD-10-CM | POA: Diagnosis not present

## 2024-01-29 DIAGNOSIS — H25042 Posterior subcapsular polar age-related cataract, left eye: Secondary | ICD-10-CM | POA: Diagnosis not present

## 2024-01-29 DIAGNOSIS — H2512 Age-related nuclear cataract, left eye: Secondary | ICD-10-CM | POA: Diagnosis not present

## 2024-01-29 DIAGNOSIS — H25012 Cortical age-related cataract, left eye: Secondary | ICD-10-CM | POA: Diagnosis not present

## 2024-02-08 ENCOUNTER — Encounter: Payer: Self-pay | Admitting: Emergency Medicine

## 2024-02-08 ENCOUNTER — Ambulatory Visit: Admitting: Emergency Medicine

## 2024-02-08 VITALS — BP 122/74 | HR 55 | Ht 60.0 in | Wt 133.4 lb

## 2024-02-08 DIAGNOSIS — K449 Diaphragmatic hernia without obstruction or gangrene: Secondary | ICD-10-CM | POA: Diagnosis not present

## 2024-02-08 DIAGNOSIS — Z85820 Personal history of malignant melanoma of skin: Secondary | ICD-10-CM

## 2024-02-08 DIAGNOSIS — C439 Malignant melanoma of skin, unspecified: Secondary | ICD-10-CM | POA: Insufficient documentation

## 2024-02-08 DIAGNOSIS — R911 Solitary pulmonary nodule: Secondary | ICD-10-CM

## 2024-02-08 DIAGNOSIS — C4361 Malignant melanoma of right upper limb, including shoulder: Secondary | ICD-10-CM

## 2024-02-08 NOTE — Assessment & Plan Note (Signed)
 Pulmonary nodule 83 year old female with a 7 mm pulmonary nodule in the anterior right upper lobe and a stable 3-4 mm nodule in the right middle lobe. Likely benign. Differential includes benign causes, inflammatory diseases, or malignancy. No smoking history or family history of lung cancer. - Order CT scan of the chest at [facility] without IV contrast for surveillance. - Schedule follow-up appointment to review CT scan results. - Discuss no further action if nodules shrink or resolve. - Establish two years of stability with follow-up imaging if nodules remain stable.

## 2024-02-08 NOTE — Assessment & Plan Note (Signed)
 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 No recurrence or symptoms.

## 2024-02-08 NOTE — Patient Instructions (Signed)
 VISIT SUMMARY:  Today, we discussed the findings from your recent chest imaging, which showed pulmonary nodules and a hiatal hernia. We also reviewed your history of melanoma and your overall health status.  YOUR PLAN:  -PULMONARY NODULE: A pulmonary nodule is a small, round growth in the lung. Your imaging showed a 7 mm nodule in the right upper lobe and a stable 3-4 mm nodule in the right middle lobe. These are likely benign, but we need to monitor them. We will order a CT scan of your chest at [facility] without IV contrast to keep an eye on these nodules. If the nodules shrink or resolve, no further action will be needed. If they remain stable, we will continue to monitor them for two years.  -MELANOMA: Melanoma is a type of skin cancer. You had a melanoma on your wrist that was removed around six years ago with clear margins, and there has been no recurrence or symptoms since then. No further action is needed at this time.  -HIATAL HERNIA: A hiatal hernia occurs when part of the stomach pushes up through the diaphragm. Your recent imaging showed a moderate-sized hiatal hernia, which has previously caused esophageal pain but is currently not causing any symptoms. No treatment is needed at this time unless symptoms develop.  INSTRUCTIONS:  Please schedule a CT scan of your chest at [facility] without IV contrast. We will have a follow-up appointment to review the results of the CT scan. If you experience any new symptoms or have concerns, please contact our office.

## 2024-02-08 NOTE — Progress Notes (Signed)
 Subjective:    Patient ID: Katelyn Moore, female    DOB: [birth year], 83 y.o.   MRN: [id number]  HPI Katelyn Moore is an 83 year old female who presents to discuss an abnormal chest imaging. She was referred for pulmonary nodules found in her chest imaging.  A cardiac PET scan on July 11, 2023, revealed a 7 mm pulmonary nodule in the anterior right upper lobe and a moderate-sized hiatal hernia. No mediastinal adenopathy was noted. Additionally, a 3-4 mm nodule in the right middle lobe was identified, which was unchanged from a previous cardiac CT chest scan on April 28, 2022.  She has a history of melanoma on her wrist, excised approximately six years ago, requiring 14 stitches. There is no personal or family history of lung cancer, and she has never been a smoker. Her husband has also not smoked in the 60 years they have been married.  She grew up on a farm in [place] with exposure to pigs, cows, chickens, and other animals. 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 She has no known exposure to tuberculosis and has consistently tested negative for TB.  She reports being generally healthy for her age, with minimal medical problems and not on extensive medication. She was hospitalized two years ago for esophageal pain, attributed to a hiatal hernia.   RADIOLOGY Cardiac PET scan: Anterior right upper lobe 7 mm pulmonary nodule, moderate size hiatal hernia, no mediastinal adenopathy (07/11/2023) CT chest: 3-4 mm central nodule in right middle lobe, unchanged (04/28/2022)   Review of Systems As per HPI  Past Medical History:  Diagnosis Date   Cancer (HCC)    Skin cancer - basal cell removed   Headache(784.0)    Hypertension    Melanoma (HCC)      No family history on file.   Social History   Socioeconomic History   Marital status: Married    Spouse name: Not on file   Number of children: Not on file    Years of education: Not on file   Highest education level: Not on file  Occupational History   Not on file  Tobacco Use   Smoking status: Never   Smokeless tobacco: Never  Vaping Use   Vaping status: Never Used  Substance and Sexual Activity   Alcohol use: Yes    Comment: Drinks rarely probably  1 glass of wine a year   Drug use: No   Sexual activity: Not on file  Other Topics Concern   Not on file  Social History Narrative   Not on file   Social Drivers of Health   Financial Resource Strain: Not on file  Food Insecurity: Not on file  Transportation Needs: Not on file  Physical Activity: Not on file  Stress: Not on file  Social Connections: Not on file  Intimate Partner Violence: Not on file    - Patient is a nonsmoker - 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 90 tablet 3   BESIVANCE  0.6 % SUSP Place 1 drop into the right eye 3 (three) times daily.     Calcium  Carb-Cholecalciferol (CALCIUM  + D3 PO) Take 600 mg by mouth in the morning and at bedtime.     chlorthalidone  (HYGROTON ) 25 MG tablet Take 1 tablet (25 mg total) by mouth daily. 90 tablet 3   ciprofloxacin (CILOXAN) 0.3 % ophthalmic solution daily. For nails     Difluprednate 0.05 % EMUL Place 1 drop into the right eye 3 (three) times daily.     dronedarone  (MULTAQ ) 400 MG tablet Take 1 tablet (400 mg total) by mouth 2 (two) times daily with a meal. 180 tablet 3   ELIQUIS  2.5 MG TABS tablet TAKE 1 TABLET(2.5 MG) BY MOUTH TWICE DAILY 180 tablet 3   magnesium  30 MG tablet Take 200 mg by mouth 2 (two) times daily.     Omega-3 Fatty Acids (FISH OIL PO) Take 1,000  mg by mouth once.     omeprazole  (PRILOSEC) 20 MG capsule Take 1 capsule (20 mg total) by mouth 2 (two) times daily before a meal. 60 capsule 0   PROLENSA 0.07 % SOLN Place 1 drop into the right eye 2 (two) times daily.     rosuvastatin  (CRESTOR ) 20 MG tablet Take 1 tablet (20 mg total) by mouth daily. 30 tablet 3   triamcinolone cream (KENALOG) 0.1 % Apply 1 Application topically 2 (two) times daily.     TURMERIC PO Take by mouth.     Ubrogepant  (UBRELVY ) 100 MG TABS Take 0.1 tablets (10 mg total) by mouth as needed. 10 tablet 11   vitamin B-12 (CYANOCOBALAMIN) 100 MCG tablet Take 100 mcg by mouth daily.     No facility-administered medications prior to visit.        Objective:   Physical Exam Vitals:   02/08/24 1343  BP: 122/74  Pulse: (!) 55  SpO2: 96%  Weight: 133 lb 6.4 oz (60.5 kg)  Height: 5' (1.524 m)   Gen: Pleasant, well-nourished, in no distress,  normal affect  ENT: No lesions,  mouth clear,  oropharynx clear, no postnasal drip  Neck: No JVD, no stridor  Lungs: No use of accessory muscles, no crackles or wheezing on normal respiration, no wheeze on forced expiration  Cardiovascular: RRR, heart sounds normal, no murmur or gallops, no peripheral edema  Musculoskeletal: No deformities, no cyanosis or clubbing  Neuro: alert, awake, non focal  Skin: Warm, no lesions or rash       Assessment & Plan:  Pulmonary nodule Pulmonary nodule 83 year old female with a 7 mm pulmonary nodule in the anterior right upper lobe and a stable 3-4 mm nodule in the right middle lobe. Likely benign. Differential includes benign causes, inflammatory diseases, or malignancy. No smoking history or family history of lung cancer. - Order CT scan of the chest at [facility] without IV contrast for surveillance. - Schedule follow-up appointment to review CT scan results. - Discuss no further action if nodules shrink or resolve. - Establish two years of stability with follow-up  imaging if nodules remain stable.   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 No recurrence or symptoms.  Hiatal hernia Hiatal hernia Moderate-sized hiatal hernia identified on cardiac PET scan. Previously associated with esophageal pain, currently asymptomatic.   Racheal Buddle, MD, PhD 02/08/2024, 2:30 PM [facility] [phone] or if no answer before 7:00PM call [phone] For any issues after 7:00PM please call eLink [phone]

## 2024-02-08 NOTE — Assessment & Plan Note (Signed)
 Hiatal hernia Moderate-sized hiatal hernia identified on cardiac PET scan. Previously associated with esophageal pain, currently asymptomatic.

## 2024-02-09 DIAGNOSIS — I10 Essential (primary) hypertension: Secondary | ICD-10-CM | POA: Diagnosis not present

## 2024-02-09 DIAGNOSIS — I2510 Atherosclerotic heart disease of native coronary artery without angina pectoris: Secondary | ICD-10-CM | POA: Diagnosis not present

## 2024-02-09 DIAGNOSIS — D6869 Other thrombophilia: Secondary | ICD-10-CM | POA: Diagnosis not present

## 2024-02-09 DIAGNOSIS — I480 Paroxysmal atrial fibrillation: Secondary | ICD-10-CM | POA: Diagnosis not present

## 2024-02-13 ENCOUNTER — Ambulatory Visit
Admission: RE | Admit: 2024-02-13 | Discharge: 2024-02-13 | Disposition: A | Discharge status: Home / Self Care | Source: Ambulatory Visit | Attending: Emergency Medicine | Admitting: Emergency Medicine

## 2024-02-13 DIAGNOSIS — R911 Solitary pulmonary nodule: Secondary | ICD-10-CM

## 2024-02-13 DIAGNOSIS — R918 Other nonspecific abnormal finding of lung field: Secondary | ICD-10-CM | POA: Diagnosis not present

## 2024-02-14 DIAGNOSIS — M25562 Pain in left knee: Secondary | ICD-10-CM | POA: Diagnosis not present

## 2024-03-06 ENCOUNTER — Encounter: Payer: Self-pay | Admitting: Emergency Medicine

## 2024-03-06 ENCOUNTER — Ambulatory Visit (INDEPENDENT_AMBULATORY_CARE_PROVIDER_SITE_OTHER): Admitting: Emergency Medicine

## 2024-03-06 VITALS — BP 125/55 | HR 65 | Ht 60.0 in | Wt 136.0 lb

## 2024-03-06 DIAGNOSIS — R911 Solitary pulmonary nodule: Secondary | ICD-10-CM

## 2024-03-06 NOTE — Patient Instructions (Signed)
 We reviewed your CT scan of the chest today. We will plan to repeat your CT chest in November 2025. Please follow Dr. Baldwin Levee in November after your scan so we can review those results together

## 2024-03-06 NOTE — Assessment & Plan Note (Signed)
 Reviewed her CT scan of the chest.  Her pulmonary nodule was measured slightly larger than on the PET/CT done 06/2023 (at 9 x 7 mm) but on my review the nodule looks stable.  I discussed this with her today.  I think it would be reasonable to continue to follow.  If there is a discernible interval change over time then would recommend either bronchoscopy or surgical resection.  She understands the plans.  I will repeat her CT in November  We reviewed your CT scan of the chest today. We will plan to repeat your CT chest in November 2025. Please follow Dr. Baldwin Levee in November after your scan so we can review those results together

## 2024-03-06 NOTE — Progress Notes (Signed)
 Subjective:    Patient ID: Katelyn Moore, female    DOB: 10/08/1940, 83 y.o.   MRN: [id number]  HPI  ROV 03/06/2024 --83 year old never smoker with history of melanoma, GERD with hiatal hernia.  I saw her last month for pulmonary nodule noted on a cardiac CT/PET.  There was an anterior right upper lobe 7 mm nodule.  She feels well.  No complaints.  No chest pain, no dyspnea.  No cough.  CT chest 02/13/2024 reviewed by me shows a 9 x 7 mm finally spiculated anterior right upper lobe pulmonary nodule, possibly slightly larger than on PET/CT done 07/11/2023.  Numerous other additional smaller nodules less than 4 mm that do not appear to have changed   Review of Systems As per HPI  Past Medical History:  Diagnosis Date   Cancer (HCC)    Skin cancer - basal cell removed   Headache(784.0)    Hypertension    Melanoma (HCC)      No family history on file.   Social History   Socioeconomic History   Marital status: Married    Spouse name: Not on file   Number of children: Not on file   Years of education: Not on file   Highest education level: Not on file  Occupational History   Not on file  Tobacco Use   Smoking status: Never   Smokeless tobacco: Never  Vaping Use   Vaping status: Never Used  Substance and Sexual Activity   Alcohol use: Yes    Comment: Drinks rarely probably  1 glass of wine a year   Drug use: No   Sexual activity: Not on file  Other Topics Concern   Not on file  Social History Narrative   Not on file   Social Drivers of Health   Financial Resource Strain: Not on file  Food Insecurity: Not on file  Transportation Needs: Not on file  Physical Activity: Not on file  Stress: Not on file  Social Connections: Not on file  Intimate Partner Violence: Not on file    - Patient is a nonsmoker - 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 90 tablet 3   BESIVANCE 0.6 % SUSP Place 1 drop into the right eye 3 (three) times daily.     Calcium  Carb-Cholecalciferol (CALCIUM  + D3 PO) Take 600 mg by mouth in the morning and at bedtime.     chlorthalidone  (HYGROTON ) 25 MG tablet Take 1 tablet (25 mg total) by mouth daily. 90 tablet 3   ciprofloxacin (CILOXAN) 0.3 % ophthalmic solution daily. For nails     Difluprednate 0.05 % EMUL Place 1 drop into the right eye 3 (three) times daily.     dronedarone  (MULTAQ ) 400 MG tablet Take 1 tablet (400 mg total) by mouth 2 (two) times daily with a meal. 180 tablet 3   ELIQUIS  2.5 MG TABS tablet TAKE 1 TABLET(2.5 MG) BY MOUTH TWICE DAILY 180 tablet 3   magnesium  30 MG tablet Take 200 mg by mouth 2 (two) times daily.  Omega-3 Fatty Acids (FISH OIL PO) Take 1,000 mg by mouth once.     omeprazole  (PRILOSEC) 20 MG capsule Take 1 capsule (20 mg total) by mouth 2 (two) times daily before a meal. 60 capsule 0   PROLENSA 0.07 % SOLN Place 1 drop into the right eye 2 (two) times daily.     rosuvastatin  (CRESTOR ) 20 MG tablet Take 1 tablet (20 mg total) by mouth daily. 30 tablet 3   triamcinolone cream (KENALOG) 0.1 % Apply 1 Application topically 2 (two) times daily.     TURMERIC PO Take by mouth.     Ubrogepant  (UBRELVY ) 100 MG TABS Take 0.1 tablets (10 mg total) by mouth as needed. 10 tablet 11   vitamin B-12 (CYANOCOBALAMIN) 100 MCG tablet Take 100 mcg by mouth daily.     No facility-administered medications prior to visit.        Objective:   Physical Exam Vitals:   03/06/24 1447  BP: (!) 125/55  Pulse: 65  SpO2: 96%  Weight: 136 lb (61.7 kg)  Height: 5' (1.524 m)   Gen: Pleasant, well-nourished, in no distress,  normal affect  ENT: No  lesions,  mouth clear,  oropharynx clear, no postnasal drip  Neck: No JVD, no stridor  Lungs: No use of accessory muscles, no crackles or wheezing on normal respiration, no wheeze on forced expiration  Cardiovascular: RRR, heart sounds normal, no murmur or gallops, no peripheral edema  Musculoskeletal: No deformities, no cyanosis or clubbing  Neuro: alert, awake, non focal  Skin: Warm, no lesions or rash       Assessment & Plan:  Pulmonary nodule Reviewed her CT scan of the chest.  Her pulmonary nodule was measured slightly larger than on the PET/CT done 06/2023 (at 9 x 7 mm) but on my review the nodule looks stable.  I discussed this with her today.  I think it would be reasonable to continue to follow.  If there is a discernible interval change over time then would recommend either bronchoscopy or surgical resection.  She understands the plans.  I will repeat her CT in November  We reviewed your CT scan of the chest today. We will plan to repeat your CT chest in November 2025. Please follow Dr. Baldwin Levee in November after your scan so we can review those results together    Racheal Buddle, MD, PhD 03/06/2024, 3:17 PM [facility] [phone] or if no answer before 7:00PM call [phone] For any issues after 7:00PM please call eLink [phone]

## 2024-03-13 DIAGNOSIS — N289 Disorder of kidney and ureter, unspecified: Secondary | ICD-10-CM | POA: Diagnosis not present

## 2024-04-10 DIAGNOSIS — Z1231 Encounter for screening mammogram for malignant neoplasm of breast: Secondary | ICD-10-CM | POA: Diagnosis not present

## 2024-04-27 ENCOUNTER — Other Ambulatory Visit: Payer: Self-pay | Admitting: Adult Health

## 2024-05-23 ENCOUNTER — Encounter: Payer: Self-pay | Admitting: Physician Assistant

## 2024-06-09 NOTE — Progress Notes (Unsigned)
  Electrophysiology Office Note:   Date:  06/11/2024  ID:  Katelyn Moore, DOB [birth year], MRN [id number]  Primary Cardiologist: Dorn Lesches, MD Primary Heart Failure: None Electrophysiologist: Fonda Kitty, MD      History of Present Illness:   Katelyn Moore is a 83 y.o. female with h/o AF, HTN, HLD, non-obs CAD, aortic insufficiency, GERD, melanoma seen today for routine electrophysiology followup.   Since last being seen in our clinic the patient reports doing well. She states she has not had any arrhythmia or evidence of one since starting Multaq .  She feels well > is working out with [name]. She has no bleeding issues on Eliquis .  She denies chest pain, palpitations, dyspnea, PND, orthopnea, nausea, vomiting, dizziness, syncope, edema, weight gain, or early satiety.   Review of systems complete and found to be negative unless listed in HPI.   EP Information / Studies Reviewed:    EKG is not ordered today. 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 The patient's score is based upon: CHF History: 0 HTN History: 1 Diabetes History: 0 Stroke History: 0 Vascular Disease History: 0 Age Score: 2 Gender Score: 1             Physical Exam:   VS:  BP 124/72   Pulse 61   Ht 5' (1.524 m)   Wt 133 lb (60.3 kg)   SpO2 98%   BMI 25.97 kg/m    Wt Readings from Last 3 Encounters:  06/11/24 133 lb (60.3 kg)  03/06/24 136 lb (61.7 kg)  02/08/24 133 lb 6.4 oz (60.5 kg)     GEN: Well nourished, well developed in no acute distress NECK: No JVD; No carotid bruits CARDIAC: Regular rate and rhythm, no murmurs, rubs, gallops RESPIRATORY:  Clear to auscultation without rales, wheezing or rhonchi  ABDOMEN: Soft, non-tender,  non-distended EXTREMITIES:  No edema; No deformity   ASSESSMENT AND PLAN:    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 -continue dronedarone  400 mg BID  -continue atenolol  50 mg daily  -pt prefers AAD over ablation   Paroxysmal Atrial Fibrillation  High Risk Medication Monitoring: Multaq  CHA2DS2-VASc 4, PAF vs pulm vein AT on monitor  -medications as above  -no symptom burden    -continue atenolol    Secondary Hypercoagulable State  -continue Eliquis  2.5 mg BID, dose reviewed and appropriate by wt / age  Asymptomatic Resting Sinus Bradycardia  -pt reports chronotropic competence  -monitor, if develops symptoms would reduce or sto p atenolol    Pulmonary Nodule  -stable on images, follow up imaging in Nov 2025 per Dr. Shelah    Follow up with Dr. Kitty / EP APP in 6 months  Signed, Daphne Barrack, NP-C, AGACNP-BC [facility] - Electrophysiology  06/11/2024, 4:57 PM

## 2024-06-11 ENCOUNTER — Ambulatory Visit: Attending: Pulmonary Disease | Admitting: Pulmonary Disease

## 2024-06-11 ENCOUNTER — Encounter: Payer: Self-pay | Admitting: Pulmonary Disease

## 2024-06-11 VITALS — BP 124/72 | HR 61 | Ht 60.0 in | Wt 133.0 lb

## 2024-06-11 DIAGNOSIS — R911 Solitary pulmonary nodule: Secondary | ICD-10-CM

## 2024-06-11 DIAGNOSIS — R001 Bradycardia, unspecified: Secondary | ICD-10-CM

## 2024-06-11 DIAGNOSIS — Z79899 Other long term (current) drug therapy: Secondary | ICD-10-CM

## 2024-06-11 DIAGNOSIS — I480 Paroxysmal atrial fibrillation: Secondary | ICD-10-CM | POA: Diagnosis not present

## 2024-06-11 DIAGNOSIS — D6869 Other thrombophilia: Secondary | ICD-10-CM | POA: Diagnosis not present

## 2024-06-11 DIAGNOSIS — I471 Supraventricular tachycardia, unspecified: Secondary | ICD-10-CM

## 2024-06-11 DIAGNOSIS — R002 Palpitations: Secondary | ICD-10-CM

## 2024-06-11 MED ORDER — DRONEDARONE HCL 400 MG PO TABS: 400.0000 mg | ORAL_TABLET | Freq: Two times a day (BID) | ORAL | 3 refills | Status: AC

## 2024-06-11 NOTE — Patient Instructions (Signed)
 Medication Instructions:  Your physician recommends that you continue on your current medications as directed. Please refer to the Current Medication list given to you today.  *If you need a refill on your cardiac medications before your next appointment, please call your pharmacy*  Lab Work: None ordered  If you have labs (blood work) drawn today and your tests are completely normal, you will receive your results only by: MyChart Message (if you have MyChart) OR A paper copy in the mail If you have any lab test that is abnormal or we need to change your treatment, we will call you to review the results.  Follow-Up: At [facility], you and your health needs are our priority.  As part of our continuing mission to provide you with exceptional heart care, our providers are all part of one team.  This team includes your primary Cardiologist (physician) and Advanced Practice Providers or APPs (Physician Assistants and Nurse Practitioners) who all work together to provide you with the care you need, when you need it.  Your next appointment:   6 month(s)  Provider:   Creighton Doffing, NP

## 2024-07-22 DIAGNOSIS — L821 Other seborrheic keratosis: Secondary | ICD-10-CM | POA: Diagnosis not present

## 2024-07-22 DIAGNOSIS — L603 Nail dystrophy: Secondary | ICD-10-CM | POA: Diagnosis not present

## 2024-07-29 ENCOUNTER — Encounter: Payer: Self-pay | Admitting: Emergency Medicine

## 2024-08-06 ENCOUNTER — Ambulatory Visit
Admission: RE | Admit: 2024-08-06 | Discharge: 2024-08-06 | Disposition: A | Source: Ambulatory Visit | Attending: Emergency Medicine | Admitting: Emergency Medicine

## 2024-08-06 DIAGNOSIS — R911 Solitary pulmonary nodule: Secondary | ICD-10-CM

## 2024-08-06 DIAGNOSIS — C439 Malignant melanoma of skin, unspecified: Secondary | ICD-10-CM | POA: Diagnosis not present

## 2024-08-21 ENCOUNTER — Encounter: Payer: Self-pay | Admitting: Emergency Medicine

## 2024-08-21 ENCOUNTER — Ambulatory Visit: Admitting: Emergency Medicine

## 2024-08-21 VITALS — BP 128/70 | HR 53 | Temp 97.8°F | Ht 59.75 in | Wt 132.5 lb

## 2024-08-21 DIAGNOSIS — R911 Solitary pulmonary nodule: Secondary | ICD-10-CM | POA: Diagnosis not present

## 2024-08-21 DIAGNOSIS — Z7901 Long term (current) use of anticoagulants: Secondary | ICD-10-CM | POA: Diagnosis not present

## 2024-08-21 DIAGNOSIS — R7303 Prediabetes: Secondary | ICD-10-CM | POA: Diagnosis not present

## 2024-08-21 DIAGNOSIS — E782 Mixed hyperlipidemia: Secondary | ICD-10-CM | POA: Diagnosis not present

## 2024-08-21 DIAGNOSIS — I10 Essential (primary) hypertension: Secondary | ICD-10-CM | POA: Diagnosis not present

## 2024-08-21 DIAGNOSIS — Z0000 Encounter for general adult medical examination without abnormal findings: Secondary | ICD-10-CM | POA: Diagnosis not present

## 2024-08-21 DIAGNOSIS — D6869 Other thrombophilia: Secondary | ICD-10-CM | POA: Diagnosis not present

## 2024-08-21 DIAGNOSIS — I2510 Atherosclerotic heart disease of native coronary artery without angina pectoris: Secondary | ICD-10-CM | POA: Diagnosis not present

## 2024-08-21 DIAGNOSIS — Z1331 Encounter for screening for depression: Secondary | ICD-10-CM | POA: Diagnosis not present

## 2024-08-21 DIAGNOSIS — K449 Diaphragmatic hernia without obstruction or gangrene: Secondary | ICD-10-CM | POA: Diagnosis not present

## 2024-08-21 DIAGNOSIS — I480 Paroxysmal atrial fibrillation: Secondary | ICD-10-CM | POA: Diagnosis not present

## 2024-08-21 NOTE — Progress Notes (Signed)
 Subjective:    Patient ID: Katelyn Moore, female    DOB: 10/25/1940, 83 y.o.   MRN: [id number]  HPI  ROV 03/06/2024 --83 year old never smoker with history of melanoma, GERD with hiatal hernia.  I saw her last month for pulmonary nodule noted on a cardiac CT/PET.  There was an anterior right upper lobe 7 mm nodule.  She feels well.  No complaints.  No chest pain, no dyspnea.  No cough.  CT chest 02/13/2024 reviewed by me shows a 9 x 7 mm finally spiculated anterior right upper lobe pulmonary nodule, possibly slightly larger than on PET/CT done 07/11/2023.  Numerous other additional smaller nodules less than 4 mm that do not appear to have changed  ROV 08/21/2024 --follow-up visit for 83 year old woman, never smoker with a history of melanoma, GERD with a hiatal hernia.  I can have seen her for an anterior right upper lobe pulmonary nodule measuring 7 mm found spuriously on a cardiac CT. She reports today that she feels well, goes to the gym 3x a week. Good functional capacity. No cough.    CT chest 08/06/2024 reviewed by me, shows no mediastinal or hilar adenopathy, several pulmonary parenchymal nodules measuring up to 8 mm in the anterior segment of the right upper lobe which is stable from 02/13/2024 question larger than prior PET/CT done 07/11/2023.  Moderate hiatal hernia   Review of Systems As per HPI  Past Medical History:  Diagnosis Date   Cancer (HCC)    Skin cancer - basal cell removed   Headache(784.0)    Hypertension    Melanoma (HCC)      History reviewed. No pertinent family history.   Social History   Socioeconomic History   Marital status: Married    Spouse name: Not on file   Number of children: Not on file   Years of education: Not on file   Highest education level: Not on file  Occupational History   Not on file  Tobacco Use   Smoking status: Never   Smokeless tobacco: Never  Vaping Use   Vaping status: Never Used  Substance and Sexual Activity    Alcohol use: Yes    Comment: Drinks rarely probably  1 glass of wine a year   Drug use: No   Sexual activity: Not on file  Other Topics Concern   Not on file  Social History Narrative   Not on file   Social Drivers of Health   Financial Resource Strain: Not on file  Food Insecurity: Not on file  Transportation Needs: Not on file  Physical Activity: Not on file  Stress: Not on file  Social Connections: Not on file  Intimate Partner Violence: Not on file    - Patient is a nonsmoker - 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 90 tablet 3   Calcium  Carb-Cholecalciferol (CALCIUM  + D3 PO) Take 600 mg by mouth in the morning and at bedtime.     chlorthalidone  (HYGROTON )  25 MG tablet TAKE 1 TABLET(25 MG) BY MOUTH DAILY 90 tablet 1   dronedarone  (MULTAQ ) 400 MG tablet Take 1 tablet (400 mg total) by mouth 2 (two) times daily with a meal. 180 tablet 3   ELIQUIS  2.5 MG TABS tablet TAKE 1 TABLET(2.5 MG) BY MOUTH TWICE DAILY 180 tablet 3   Multiple Vitamin (MULTIVITAMIN) tablet Take 1 tablet by mouth daily. (Patient taking differently: Take 2 tablets by mouth daily.)     rosuvastatin  (CRESTOR ) 20 MG tablet Take 1 tablet (20 mg total) by mouth daily. 30 tablet 3   TURMERIC PO Take by mouth.     Ubrogepant  (UBRELVY ) 100 MG TABS Take 0.1 tablets (10 mg total) by mouth as needed. 10 tablet 11   Difluprednate 0.05 % EMUL Place 1 drop into the right eye 3 (three) times daily. (Patient not taking: Reported on 08/21/2024)     magnesium  30 MG tablet Take 200 mg by  mouth 2 (two) times daily. (Patient not taking: Reported on 08/21/2024)     Omega-3 Fatty Acids (FISH OIL PO) Take 1,000 mg by mouth once. (Patient not taking: Reported on 08/21/2024)     omeprazole  (PRILOSEC) 20 MG capsule Take 1 capsule (20 mg total) by mouth 2 (two) times daily before a meal. (Patient not taking: Reported on 08/21/2024) 60 capsule 0   triamcinolone cream (KENALOG) 0.1 % Apply 1 Application topically 2 (two) times daily. (Patient not taking: Reported on 08/21/2024)     vitamin B-12 (CYANOCOBALAMIN) 100 MCG tablet Take 100 mcg by mouth daily. (Patient not taking: Reported on 08/21/2024)     No facility-administered medications prior to visit.        Objective:   Physical Exam Vitals:   08/21/24 1442  BP: 128/70  Pulse: (!) 53  Temp: 97.8 F (36.6 C)  SpO2: 99%  Weight: 132 lb 8 oz (60.1 kg)  Height: 4' 11.75 (1.518 m)    Gen: Pleasant, well-nourished, in no distress,  normal affect  ENT: No lesions,  mouth clear,  oropharynx clear, no postnasal drip  Neck: No JVD, no stridor  Lungs: No use of accessory muscles, no crackles or wheezing on normal respiration, no wheeze on forced expiration  Cardiovascular: RRR, heart sounds normal, no murmur or gallops, no peripheral edema  Musculoskeletal: No deformities, no cyanosis or clubbing  Neuro: alert, awake, non focal  Skin: Warm, no lesions or rash       Assessment & Plan:  Pulmonary nodule CT chest to follow 8 mm right upper lobe nodule reviewed.  Stable in size and appearance.  We will repeat in 6 months.  If stable at that time we can space it out to a year.  Did discuss with her that we want to get at least 2 years of stability to support that this is a benign nodule.  If it changes in size or we become more suspicious for malignancy then we will arrange for bronchoscopy and biopsies.     Lamar Chris, MD, PhD 08/21/2024, 2:56 PM [facility] [phone] or if no answer before  7:00PM call [phone] For any issues after 7:00PM please call eLink [phone]

## 2024-08-21 NOTE — Patient Instructions (Addendum)
 We reviewed your CT scan of the chest today.  This is stable compared with your priors.  Good news. We will plan to repeat your CT chest in May 2026. Follow with Dr. Shelah in May 2026 after your scan so we can review those results together.

## 2024-08-21 NOTE — Assessment & Plan Note (Signed)
 CT chest to follow 8 mm right upper lobe nodule reviewed.  Stable in size and appearance.  We will repeat in 6 months.  If stable at that time we can space it out to a year.  Did discuss with her that we want to get at least 2 years of stability to support that this is a benign nodule.  If it changes in size or we become more suspicious for malignancy then we will arrange for bronchoscopy and biopsies.

## 2024-10-05 ENCOUNTER — Other Ambulatory Visit: Payer: Self-pay | Admitting: Cardiovascular Disease

## 2024-10-05 DIAGNOSIS — I480 Paroxysmal atrial fibrillation: Secondary | ICD-10-CM

## 2024-10-08 NOTE — Telephone Encounter (Signed)
 Eliquis  2.5mg  refill request received. Patient is 84 years old, weight-60.1kg, Crea-1.0 on 02/09/24 via [facility] from [place], [place], and last seen by Daphne Barrack, NP on 06/11/24. Dose is appropriate based on dosing criteria. Will send in refill to requested pharmacy.

## 2024-10-25 ENCOUNTER — Telehealth: Payer: Self-pay | Admitting: Pharmacy Technician

## 2024-10-25 ENCOUNTER — Other Ambulatory Visit (HOSPITAL_COMMUNITY): Payer: Self-pay

## 2024-10-25 NOTE — Telephone Encounter (Signed)
 Pharmacy Patient Advocate Encounter   Received notification from [facility] that prior authorization for Ubrelvy  100MG  tablets is required/requested.   Insurance verification completed.   The patient is insured through [facility]   Per test claim: PA required; PA submitted to above mentioned insurance via Latent Key/confirmation #/EOC [id number] Status is pending

## 2024-10-25 NOTE — Telephone Encounter (Signed)
 Pharmacy Patient Advocate Encounter  Received notification from [facility] that Prior Authorization for Ubrelvy  100MG  tablets has been APPROVED from 10/25/2024 to 10/25/2025. Ran test claim, Copay is $0.00. This test claim was processed through [facility]- copay amounts may vary at other pharmacies due to pharmacy/plan contracts, or as the patient moves through the different stages of their insurance plan.   PA #/Case ID/Reference #: [id number]

## 2024-11-01 ENCOUNTER — Encounter: Admitting: Physician Assistant
